# Patient Record
Sex: Male | Born: 2000 | Race: Black or African American | Hispanic: No | Marital: Single | Smoking: Never smoker
Health system: Southern US, Community
[De-identification: ages and names within clinical notes are randomized; demographics above are authoritative.]

## PROBLEM LIST (undated history)

## (undated) HISTORY — PX: COLON SURGERY: SHX602

## (undated) HISTORY — PX: CHOLECYSTECTOMY: SHX55

---
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
## 2020-02-19 ENCOUNTER — Ambulatory Visit (INDEPENDENT_AMBULATORY_CARE_PROVIDER_SITE_OTHER): Payer: 59 | Admitting: Family Medicine

## 2020-02-19 ENCOUNTER — Encounter: Payer: Self-pay | Admitting: Family Medicine

## 2020-02-19 ENCOUNTER — Other Ambulatory Visit: Payer: Self-pay

## 2020-02-19 VITALS — BP 106/72 | HR 77 | Temp 98.0°F | Ht 73.07 in | Wt 293.2 lb

## 2020-02-19 DIAGNOSIS — Z23 Encounter for immunization: Secondary | ICD-10-CM

## 2020-02-19 DIAGNOSIS — Z6838 Body mass index (BMI) 38.0-38.9, adult: Secondary | ICD-10-CM | POA: Diagnosis not present

## 2020-02-19 DIAGNOSIS — E6609 Other obesity due to excess calories: Secondary | ICD-10-CM

## 2020-02-19 DIAGNOSIS — Z0000 Encounter for general adult medical examination without abnormal findings: Secondary | ICD-10-CM | POA: Diagnosis not present

## 2020-02-19 NOTE — Progress Notes (Addendum)
Subjective:     Roshard Britton Bera is a 19 y.o. male and is here for f/u and immunizations. Pt initially scheduled for CPE, but had CPE in March 2021.  The patient reports no problems.  Pt doing well.  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  Social History   Socioeconomic History  . Marital status: Single    Spouse name: Not on file  . Number of children: Not on file  . Years of education: Not on file  . Highest education level: Not on file  Occupational History  . Not on file  Tobacco Use  . Smoking status: Passive Smoke Exposure - Never Smoker  . Smokeless tobacco: Never Used  Substance and Sexual Activity  . Alcohol use: No  . Drug use: No  . Sexual activity: Not on file  Other Topics Concern  . Not on file  Social History Narrative  . Not on file   Social Determinants of Health   Financial Resource Strain:   . Difficulty of Paying Living Expenses: Not on file  Food Insecurity:   . Worried About Programme researcher, broadcasting/film/video in the Last Year: Not on file  . Ran Out of Food in the Last Year: Not on file  Transportation Needs:   . Lack of Transportation (Medical): Not on file  . Lack of Transportation (Non-Medical): Not on file  Physical Activity:   . Days of Exercise per Week: Not on file  . Minutes of Exercise per Session: Not on file  Stress:   . Feeling of Stress : Not on file  Social Connections:   . Frequency of Communication with Friends and Family: Not on file  . Frequency of Social Gatherings with Friends and Family: Not on file  . Attends Religious Services: Not on file  . Active Member of Clubs or Organizations: Not on file  . Attends Banker Meetings: Not on file  . Marital Status: Not on file  Intimate Partner Violence:   . Fear of Current or Ex-Partner: Not on file  . Emotionally Abused: Not on file  . Physically Abused: Not on file  . Sexually Abused: Not on file   Health Maintenance  Topic Date Due  .  Hepatitis C Screening  Never done  . HIV Screening  Never done  . TETANUS/TDAP  Never done  . INFLUENZA VACCINE  11/30/2019  . COVID-19 Vaccine  Completed    The following portions of the patient's history were reviewed and updated as appropriate: allergies, current medications, past family history, past medical history, past social history, past surgical history and problem list.  Review of Systems A comprehensive review of systems was negative.   Objective:    BP 106/72 (BP Location: Right Arm, Patient Position: Sitting, Cuff Size: Normal)   Pulse 77   Temp 98 F (36.7 C) (Oral)   Ht 6' 1.07" (1.856 m)   Wt 293 lb 3.2 oz (133 kg)   SpO2 99%   BMI 38.61 kg/m  General appearance: alert, cooperative, appears stated age and no distress Head: Normocephalic, without obvious abnormality, atraumatic Eyes: conjunctivae/corneas clear. PERRL, EOM's intact. Fundi benign. Ears: normal TM's and external ear canals both ears Nose: Nares normal. Septum midline. Mucosa normal. No drainage or sinus tenderness. Throat: lips, mucosa, and tongue normal; teeth and gums normal Neck: no adenopathy, no carotid bruit, no JVD, supple, symmetrical, trachea midline and mild thyromegaly, symmetric, without nodules Lungs: clear to auscultation  bilaterally Heart: regular rate and rhythm, S1, S2 normal, no murmur, click, rub or gallop Abdomen: soft, non-tender; bowel sounds normal; no masses,  no organomegaly Extremities: extremities normal, atraumatic, no cyanosis or edema Pulses: 2+ and symmetric Skin: Skin color, texture, turgor normal. No rashes or lesions Lymph nodes: Cervical, supraclavicular, and axillary nodes normal. Neurologic: Alert and oriented X 3, normal strength and tone. Normal symmetric reflexes. Normal coordination and gait    Assessment:    Healthy male exam with obesity.     Plan:   Class 2 obesity due to excess calories without serious comorbidities with BMI 38.0-38.9 -mild  thyromegaly -discussed increasing physical activity and other lifestyle modifications -will obtain labs - Plan: Lipid panel, Hemoglobin A1c, TSH, T4, free  Need for diphtheria-tetanus-pertussis (Tdap) vaccine  - Plan: Tdap vaccine greater than or equal to 7yo IM  Need for immunization against influenza  - Plan: Flu Vaccine QUAD 6+ mos PF IM (Fluarix Quad PF)  F/u prn  Abbe Amsterdam, MD

## 2020-02-19 NOTE — Patient Instructions (Signed)
Preventive Care 23-19 Years Old, Male Preventive care refers to lifestyle choices and visits with your health care provider that can promote health and wellness. At this stage in your life, you may start seeing a primary care physician instead of a pediatrician. Your health care is now your responsibility. Preventive care for young adults includes:  A yearly physical exam. This is also called an annual wellness visit.  Regular dental and eye exams.  Immunizations.  Screening for certain conditions.  Healthy lifestyle choices, such as diet and exercise. What can I expect for my preventive care visit? Physical exam Your health care provider may check:  Height and weight. These may be used to calculate body mass index (BMI), which is a measurement that tells if you are at a healthy weight.  Heart rate and blood pressure.  Body temperature. Counseling Your health care provider may ask you questions about:  Past medical problems and family medical history.  Alcohol, tobacco, and drug use.  Home and relationship well-being.  Access to firearms.  Emotional well-being.  Diet, exercise, and sleep habits.  Sexual activity and sexual health. What immunizations do I need?  Influenza (flu) vaccine  This is recommended every year. Tetanus, diphtheria, and pertussis (Tdap) vaccine  You may need a Td booster every 10 years. Varicella (chickenpox) vaccine  You may need this vaccine if you have not already been vaccinated. Human papillomavirus (HPV) vaccine  If recommended by your health care provider, you may need three doses over 6 months. Measles, mumps, and rubella (MMR) vaccine  You may need at least one dose of MMR. You may also need a second dose. Meningococcal conjugate (MenACWY) vaccine  One dose is recommended if you are 68-54 years old and a Market researcher living in a residence hall, or if you have one of several medical conditions. You may also need  additional booster doses. Pneumococcal conjugate (PCV13) vaccine  You may need this if you have certain conditions and were not previously vaccinated. Pneumococcal polysaccharide (PPSV23) vaccine  You may need one or two doses if you smoke cigarettes or if you have certain conditions. Hepatitis A vaccine  You may need this if you have certain conditions or if you travel or work in places where you may be exposed to hepatitis A. Hepatitis B vaccine  You may need this if you have certain conditions or if you travel or work in places where you may be exposed to hepatitis B. Haemophilus influenzae type b (Hib) vaccine  You may need this if you have certain risk factors. You may receive vaccines as individual doses or as more than one vaccine together in one shot (combination vaccines). Talk with your health care provider about the risks and benefits of combination vaccines. What tests do I need? Blood tests  Lipid and cholesterol levels. These may be checked every 5 years starting at age 24.  Hepatitis C test.  Hepatitis B test. Screening  Genital exam to check for testicular cancer or hernias.  Sexually transmitted disease (STD) testing, if you are at risk. Other tests  Tuberculosis skin test.  Vision and hearing tests.  Skin exam. Follow these instructions at home: Eating and drinking   Eat a diet that includes fresh fruits and vegetables, whole grains, lean protein, and low-fat dairy products.  Drink enough fluid to keep your urine pale yellow.  Do not drink alcohol if: ? Your health care provider tells you not to drink. ? You are under the legal drinking  age. In the [place] the legal drinking age is 40.  If you drink alcohol: ? Limit how much you have to 0-2 drinks a day. ? Be aware of how much alcohol is in your drink. In the [place] one drink equals one 12 oz bottle of beer (355 mL), one 5 oz glass of wine (148 mL), or one 1 oz glass of hard liquor (44  mL). Lifestyle  Take daily care of your teeth and gums.  Stay active. Exercise at least 30 minutes 5 or more days of the week.  Do not use any products that contain nicotine or tobacco, such as cigarettes, e-cigarettes, and chewing tobacco. If you need help quitting, ask your health care provider.  Do not use drugs.  If you are sexually active, practice safe sex. Use a condom or other form of protection to prevent STIs (sexually transmitted infections).  Find healthy ways to cope with stress, such as: ? Meditation, yoga, or listening to music. ? Journaling. ? Talking to a trusted person. ? Spending time with friends and family. Safety  Always wear your seat belt while driving or riding in a vehicle.  Do not drive if you have been drinking alcohol.  Do not ride with someone who has been drinking.  Do not drive when you are tired or distracted.  Do not text while driving.  Wear a helmet and other protective equipment during sports activities.  If you have firearms in your house, make sure you follow all gun safety procedures.  Seek help if you have been bullied, physically abused, or sexually abused.  Use the Internet responsibly to avoid dangers such as online bullying and online sex predators. What's next?  Go to your health care provider once a year for a well check visit.  Ask your health care provider how often you should have your eyes and teeth checked.  Stay up to date on all vaccines. This information is not intended to replace advice given to you by your health care provider. Make sure you discuss any questions you have with your health care provider. Document Revised: 04/11/2018 Document Reviewed: 04/11/2018 Elsevier Patient Education  2020 [employer].  Immunization Schedule, 53-19 Years Old Vaccines are usually given at various ages, according to a schedule. Your health care provider will recommend vaccines for you based on your age, medical history, and  lifestyle or other factors, such as travel or where you work. You may receive vaccines as individual doses or as more than one vaccine together in one shot (combination vaccine). Talk with your health care provider about the risks and benefits of combination vaccines. Before you get a vaccine: Talk with your health care provider about which vaccines are right for you. This is especially important if:  You previously had a reaction after getting a vaccine.  You have a weakened disease-fighting system (immune system). You may have a weakened immune system if you: ? Are taking medicines that reduce (suppress) the activity of your immune system. ? Are taking medicines to treat cancer (chemotherapy). ? Have HIV or AIDS.  You work in an environment where you may be exposed to a disease.  You plan to travel outside of the country.  You have a chronic illness, such as heart disease, kidney disease, diabetes, or lung disease. Recommended immunizations for 71-69 years old Influenza vaccine This may also be called the IIV, RIV, or LAIV vaccine.  You should get a dose of the influenza vaccine every year. Tetanus, diphtheria, and  pertussis vaccine This is also known as the Tdap vaccine. You should get 1 dose of the Tdap vaccine if:  You have not previously gotten a Tdap vaccine.  You do not know if you have ever gotten a Tdap vaccine. Women should get 1 dose of the Tdap vaccine during each pregnancy. It is recommended that pregnant women receive this vaccine between 27 and 36 weeks of pregnancy. Tetanus, diphtheria vaccine This is also known as the Td vaccine.  You should get a dose of the Td vaccine every 10 years after 1 dose of Tdap vaccine. Measles, mumps, and rubella vaccine This is also known as the MMR vaccine. You may need to get the MMR vaccine if:  You need to catch up on doses you missed in the past.  You have not been given the vaccine before.  You do not have evidence of  immunity (by a blood test). Pregnant women should not get the MMR vaccine during pregnancy because it may be harmful to the unborn baby. However, if you are not immune to measles, mumps, or rubella, you should get a dose of MMR vaccine within days after delivery. Varicella vaccine This is also known as the VAR vaccine. You may need to get the VAR vaccine if:  You need to catch up on doses you missed in the past.  You have not been given the vaccine before.  You do not have evidence of immunity (by a blood test).  You have certain high-risk conditions, such as HIV or AIDS. Pregnant women should not get the VAR vaccine during pregnancy because it may be harmful to the unborn baby. However, if you are not immune to chickenpox (varicella), you should get a dose of the VAR vaccine within days after delivery. Human papillomavirus vaccine This is also known as the HPV vaccine. You should get the HPV vaccine if:  You have not gotten the vaccine before. The vaccine is recommended for all adults through age 48.  You need to catch up on doses you missed in the past. Pneumococcal conjugate vaccine This is also known as the PCV13 vaccine. You should get the PCV13 vaccine as recommended if you have certain high-risk conditions. These include:  Diabetes.  Chronic conditions of the heart, lungs, or liver.  Conditions that affect the immune system. Pneumococcal polysaccharide vaccine This is also known as the PPSV23 vaccine. You should get the PPSV23 vaccine as recommended if you have certain high-risk conditions. These include:  Diabetes.  Chronic conditions of the heart, lungs, or liver.  Conditions that affect the immune system. Hepatitis A vaccine This is also known as the HepA vaccine. If you did not get the HepA vaccine previously, you should get it if:  You are at risk for a hepatitis A infection. You may be at risk for infection if you: ? Have chronic liver disease. ? Have HIV or  AIDS. ? Are a man who has sex with men. ? Use illegal drugs. ? Are homeless. ? May be exposed to hepatitis A through work. ? Travel to countries where hepatitis A is common. ? Are pregnant. ? Have or will have close contact with someone who was adopted from another country.  You are not at risk for infection but want protection from hepatitis A. Hepatitis B vaccine This is also known as the HepB vaccine. If you did not get the HepB vaccine previously, you should get it if:  You are at risk for hepatitis B infection. You are  at risk if you: ? Have chronic liver disease. ? Have HIV or AIDS. ? Have sex with a partner who has hepatitis B, or:  You have multiple sex partners.  You are a man who has sex with men. ? Use illegal drugs. ? May be exposed to hepatitis B through work. ? Live with someone who has hepatitis B. ? Receive dialysis treatment. ? Have diabetes mellitus. ? Travel to countries where hepatitis B is common. ? Are pregnant.  You are not at risk of infection but want protection from hepatitis B. Meningococcal conjugate vaccine This is also known as the MenACWY vaccine. You may need to get the MenACWY vaccine if you:  Have not been given the vaccine before.  Need to catch up on doses you missed in the past. This vaccine is especially important if you:  Do not have a spleen.  Have sickle cell disease.  Have HIV.  Take medicines that suppress your immune system.  Travel to countries where meningococcal disease is common.  Are exposed to Neisseria meningitidis at work. Serogroup B meningococcal vaccine This is also known as the MenB vaccine. You may need to get the MenB vaccine if you:  Have not been given the vaccine before.  Need to catch up on doses you missed in the past. This vaccine is especially important if you:  Do not have a spleen.  Have sickle cell disease.  Take medicines that suppress your immune system.  Are exposed to Neisseria  meningitidis at work. Haemophilus influenzae type b vaccine This is also known as the Hib vaccine. Anyone older than 19 years of age is usually not given the Hib vaccine. However, if you have certain high-risk conditions, you may need to get this vaccine. These conditions include:  Not having a spleen.  Having received a stem cell transplant. Summary  Before you get a vaccine, tell your health care provider if you have reacted to vaccines in the past or have a condition that weakens your immune system.  At 19-21 years, you should get a dose of the flu vaccine every year and a dose of the Td vaccine if 10 years have passed since you had a Tdap vaccine.  Depending on your medical history and your risk factors, you may also need other vaccines. Ask your health care provider whether you are up to date on all your vaccines.  Women who are pregnant may not receive certain vaccines. Ask your health care provider whether you should receive any vaccines soon after you deliver your baby. This information is not intended to replace advice given to you by your health care provider. Make sure you discuss any questions you have with your health care provider. Document Revised: 08/08/2018 Document Reviewed: 07/05/2018 Elsevier Patient Education  [place].

## 2020-02-20 LAB — CBC WITH DIFFERENTIAL/PLATELET
Absolute Monocytes: 713 cells/uL (ref 200–950)
Basophils Absolute: 22 cells/uL (ref 0–200)
Basophils Relative: 0.3 %
Eosinophils Absolute: 259 cells/uL (ref 15–500)
Eosinophils Relative: 3.6 %
HCT: 42.6 % (ref 38.5–50.0)
Hemoglobin: 14.2 g/dL (ref 13.2–17.1)
Lymphs Abs: 2902 cells/uL (ref 850–3900)
MCH: 27.3 pg (ref 27.0–33.0)
MCHC: 33.3 g/dL (ref 32.0–36.0)
MCV: 81.9 fL (ref 80.0–100.0)
MPV: 11 fL (ref 7.5–12.5)
Monocytes Relative: 9.9 %
Neutro Abs: 3305 cells/uL (ref 1500–7800)
Neutrophils Relative %: 45.9 %
Platelets: 283 10*3/uL (ref 140–400)
RBC: 5.2 10*6/uL (ref 4.20–5.80)
RDW: 13.1 % (ref 11.0–15.0)
Total Lymphocyte: 40.3 %
WBC: 7.2 10*3/uL (ref 3.8–10.8)

## 2020-02-20 LAB — BASIC METABOLIC PANEL WITH GFR
BUN: 8 mg/dL (ref 7–20)
CO2: 29 mmol/L (ref 20–32)
Calcium: 10.1 mg/dL (ref 8.9–10.4)
Chloride: 106 mmol/L (ref 98–110)
Creat: 1.08 mg/dL (ref 0.60–1.26)
GFR, Est African American: 115 mL/min/{1.73_m2} (ref >=60)
GFR, Est Non African American: 99 mL/min/{1.73_m2} (ref >=60)
Glucose, Bld: 107 mg/dL — ABNORMAL HIGH (ref 65–99)
Potassium: 4.7 mmol/L (ref 3.8–5.1)
Sodium: 141 mmol/L (ref 135–146)

## 2020-02-20 LAB — LIPID PANEL
Cholesterol: 148 mg/dL (ref <170)
HDL: 43 mg/dL — ABNORMAL LOW (ref >45)
LDL Cholesterol (Calc): 88 mg/dL (calc) (ref <110)
Non-HDL Cholesterol (Calc): 105 mg/dL (calc) (ref <120)
Total CHOL/HDL Ratio: 3.4 (calc) (ref <5.0)
Triglycerides: 83 mg/dL (ref <90)

## 2020-02-20 LAB — T4, FREE: Free T4: 1.1 ng/dL (ref 0.8–1.4)

## 2020-02-20 LAB — TSH: TSH: 5.53 mIU/L — ABNORMAL HIGH (ref 0.50–4.30)

## 2020-02-20 LAB — HEMOGLOBIN A1C
Hgb A1c MFr Bld: 5.7 % of total Hgb — ABNORMAL HIGH (ref <5.7)
Mean Plasma Glucose: 117 (calc)
eAG (mmol/L): 6.5 (calc)

## 2020-02-23 ENCOUNTER — Encounter: Payer: Self-pay | Admitting: Family Medicine

## 2020-02-23 ENCOUNTER — Telehealth: Payer: Self-pay | Admitting: *Deleted

## 2020-02-23 NOTE — Telephone Encounter (Signed)
Patient states he is returning a call.

## 2020-02-25 NOTE — Telephone Encounter (Signed)
Returned pt call left a message to call the office back

## 2020-03-11 NOTE — Telephone Encounter (Signed)
Not able to speak with pt due to pt not answering my call and not calling the office back

## 2020-04-26 ENCOUNTER — Other Ambulatory Visit (HOSPITAL_COMMUNITY): Payer: Self-pay | Admitting: Dermatology

## 2020-04-26 DIAGNOSIS — L718 Other rosacea: Secondary | ICD-10-CM | POA: Diagnosis not present

## 2020-04-26 DIAGNOSIS — L738 Other specified follicular disorders: Secondary | ICD-10-CM | POA: Diagnosis not present

## 2020-04-26 DIAGNOSIS — L218 Other seborrheic dermatitis: Secondary | ICD-10-CM | POA: Diagnosis not present

## 2020-05-04 ENCOUNTER — Ambulatory Visit: Payer: 59 | Attending: Internal Medicine

## 2020-05-04 DIAGNOSIS — Z23 Encounter for immunization: Secondary | ICD-10-CM

## 2020-05-04 NOTE — Progress Notes (Signed)
   Covid-[facility] Vaccination [facility]  Name:  Dhiren Azimi    MRN: [id number] DOB: Dec 05, 2000  05/04/2020  Mr. Carandang was observed post Covid-19 immunization for 15 minutes without incident. He was provided with Vaccine Information Sheet and instruction to access the V-Safe system.   Mr. Farruggia was instructed to call 911 with any severe reactions post vaccine: Marland Kitchen Difficulty breathing  . Swelling of face and throat  . A fast heartbeat  . A bad rash all over body  . Dizziness and weakness   Immunizations Administered    Name Date Dose VIS Date Route   Pfizer COVID-19 Vaccine 05/04/2020  2:51 PM 0.3 mL 02/18/2020 Intramuscular   Manufacturer: [employer], [employer]   Lot: [id number]   NDC: [address]-9

## 2020-10-28 ENCOUNTER — Ambulatory Visit: Payer: 59 | Attending: Critical Care Medicine

## 2020-10-28 DIAGNOSIS — Z20822 Contact with and (suspected) exposure to covid-19: Secondary | ICD-10-CM

## 2020-10-29 LAB — NOVEL CORONAVIRUS, NAA: SARS-CoV-2, NAA: DETECTED — AB

## 2020-10-29 LAB — SARS-COV-2, NAA 2 DAY TAT

## 2020-11-03 ENCOUNTER — Ambulatory Visit: Payer: 59 | Attending: Internal Medicine

## 2020-11-03 DIAGNOSIS — Z20822 Contact with and (suspected) exposure to covid-19: Secondary | ICD-10-CM

## 2020-11-04 LAB — SPECIMEN STATUS REPORT

## 2020-11-04 LAB — SARS-COV-2, NAA 2 DAY TAT

## 2020-11-04 LAB — NOVEL CORONAVIRUS, NAA: SARS-CoV-2, NAA: DETECTED — AB

## 2021-01-04 ENCOUNTER — Other Ambulatory Visit (HOSPITAL_COMMUNITY): Payer: Self-pay

## 2021-02-07 ENCOUNTER — Other Ambulatory Visit (HOSPITAL_COMMUNITY): Payer: Self-pay

## 2021-02-23 ENCOUNTER — Other Ambulatory Visit: Payer: Self-pay

## 2021-02-23 ENCOUNTER — Encounter: Payer: Self-pay | Admitting: Family Medicine

## 2021-02-23 ENCOUNTER — Ambulatory Visit (INDEPENDENT_AMBULATORY_CARE_PROVIDER_SITE_OTHER): Payer: 59 | Admitting: Family Medicine

## 2021-02-23 VITALS — BP 128/78 | HR 69 | Temp 97.9°F | Ht 72.0 in | Wt 286.0 lb

## 2021-02-23 DIAGNOSIS — E038 Other specified hypothyroidism: Secondary | ICD-10-CM | POA: Diagnosis not present

## 2021-02-23 DIAGNOSIS — E6609 Other obesity due to excess calories: Secondary | ICD-10-CM | POA: Diagnosis not present

## 2021-02-23 DIAGNOSIS — Z6838 Body mass index (BMI) 38.0-38.9, adult: Secondary | ICD-10-CM

## 2021-02-23 DIAGNOSIS — Z23 Encounter for immunization: Secondary | ICD-10-CM | POA: Diagnosis not present

## 2021-02-23 DIAGNOSIS — Z0000 Encounter for general adult medical examination without abnormal findings: Secondary | ICD-10-CM | POA: Diagnosis not present

## 2021-02-23 LAB — BASIC METABOLIC PANEL
BUN: 8 mg/dL (ref 6–23)
CO2: 27 mEq/L (ref 19–32)
Calcium: 10 mg/dL (ref 8.4–10.5)
Chloride: 103 mEq/L (ref 96–112)
Creatinine, Ser: 1.04 mg/dL (ref 0.40–1.50)
GFR: 103.42 mL/min (ref >60.00)
Glucose, Bld: 107 mg/dL — ABNORMAL HIGH (ref 70–99)
Potassium: 3.8 mEq/L (ref 3.5–5.1)
Sodium: 138 mEq/L (ref 135–145)

## 2021-02-23 LAB — LIPID PANEL
Cholesterol: 153 mg/dL (ref 0–200)
HDL: 41.8 mg/dL (ref >39.00)
LDL Cholesterol: 94 mg/dL (ref 0–99)
NonHDL: 111.09
Total CHOL/HDL Ratio: 4
Triglycerides: 87 mg/dL (ref 0.0–149.0)
VLDL: 17.4 mg/dL (ref 0.0–40.0)

## 2021-02-23 LAB — TSH: TSH: 2.24 u[IU]/mL (ref 0.35–5.50)

## 2021-02-23 LAB — CBC WITH DIFFERENTIAL/PLATELET
Basophils Absolute: 0 10*3/uL (ref 0.0–0.1)
Basophils Relative: 0.2 % (ref 0.0–3.0)
Eosinophils Absolute: 0.2 10*3/uL (ref 0.0–0.7)
Eosinophils Relative: 2.7 % (ref 0.0–5.0)
HCT: 42.7 % (ref 39.0–52.0)
Hemoglobin: 14.1 g/dL (ref 13.0–17.0)
Lymphocytes Relative: 27.8 % (ref 12.0–46.0)
Lymphs Abs: 1.6 10*3/uL (ref 0.7–4.0)
MCHC: 33 g/dL (ref 30.0–36.0)
MCV: 80.8 fl (ref 78.0–100.0)
Monocytes Absolute: 0.5 10*3/uL (ref 0.1–1.0)
Monocytes Relative: 9.3 % (ref 3.0–12.0)
Neutro Abs: 3.4 10*3/uL (ref 1.4–7.7)
Neutrophils Relative %: 60 % (ref 43.0–77.0)
Platelets: 280 10*3/uL (ref 150.0–400.0)
RBC: 5.28 Mil/uL (ref 4.22–5.81)
RDW: 13.7 % (ref 11.5–14.6)
WBC: 5.7 10*3/uL (ref 4.5–10.5)

## 2021-02-23 LAB — HEMOGLOBIN A1C: Hgb A1c MFr Bld: 5.9 % (ref 4.6–6.5)

## 2021-02-23 LAB — T4, FREE: Free T4: 0.84 ng/dL (ref 0.60–1.60)

## 2021-02-23 LAB — VITAMIN D 25 HYDROXY (VIT D DEFICIENCY, FRACTURES): VITD: 10.42 ng/mL — ABNORMAL LOW (ref 30.00–100.00)

## 2021-02-23 NOTE — Addendum Note (Signed)
Addended by: Elwin Mocha on: 02/23/2021 11:15 AM   Modules accepted: Orders

## 2021-02-23 NOTE — Progress Notes (Signed)
Subjective:     Roy Green is a 20 y.o. male and is here for a comprehensive physical exam. Pt states he is doing well without issues.  Has another yr in college, then planning to go to law school.    Social History   Socioeconomic History   Marital status: Single    Spouse name: Not on file   Number of children: Not on file   Years of education: Not on file   Highest education level: Not on file  Occupational History   Not on file  Tobacco Use   Smoking status: Passive Smoke Exposure - Never Smoker   Smokeless tobacco: Never  Substance and Sexual Activity   Alcohol use: No   Drug use: No   Sexual activity: Not on file  Other Topics Concern   Not on file  Social History Narrative   Not on file   Social Determinants of Health   Financial Resource Strain: Not on file  Food Insecurity: Not on file  Transportation Needs: Not on file  Physical Activity: Not on file  Stress: Not on file  Social Connections: Not on file  Intimate Partner Violence: Not on file   Health Maintenance  Topic Date Due   HIV Screening  Never done   Hepatitis C Screening  Never done   COVID-19 Vaccine (4 - Booster for Pfizer series) 06/29/2020   INFLUENZA VACCINE  11/29/2020   TETANUS/TDAP  02/18/2030   HPV VACCINES  Completed   Pneumococcal Vaccine 85-19 Years old  Aged Out    The following portions of the patient's history were reviewed and updated as appropriate: allergies, current medications, past family history, past medical history, past social history, past surgical history, and problem list.  Review of Systems Pertinent items noted in HPI and remainder of comprehensive ROS otherwise negative.   Objective:    BP 128/78 (BP Location: Right Arm, Patient Position: Sitting, Cuff Size: Large)   Pulse 69   Temp 97.9 F (36.6 C) (Oral)   Ht 6' (1.829 m)   Wt 286 lb (129.7 kg)   SpO2 99%   BMI 38.79 kg/m   General: Pleasant, cooperative, in NAD Head: Normocephalic, without  obvious abnormality, atraumatic Eyes: conjunctivae/corneas clear. PERRL, EOM's intact. Fundi benign. Ears: normal TM's and external ear canals both ears Nose: Nares normal. Septum midline. Mucosa normal. No drainage or sinus tenderness. Throat: lips, mucosa, and tongue normal; teeth and gums normal Neck: no adenopathy, no carotid bruit, no JVD, supple, symmetrical, trachea midline, and thyroid not enlarged, symmetric, no tenderness/mass/nodules Lungs: clear to auscultation bilaterally Heart: regular rate and rhythm, S1, S2 normal, no murmur, click, rub or gallop Abdomen: soft, non-tender; bowel sounds normal; no masses,  no organomegaly Extremities: extremities normal, atraumatic, no cyanosis or edema Pulses: 2+ and symmetric Skin: Skin color, texture, turgor normal.  Mild acanthosis nigricans.  No rashes or lesions Lymph nodes: Cervical, supraclavicular, and axillary nodes normal. Neurologic: Alert and oriented X 3, normal strength and tone. Normal symmetric reflexes. Normal coordination and gait    Assessment:    Healthy male exam.      Plan:    Anticipatory guidance given including wearing seatbelts, smoke detectors in the home, increasing physical activity, increasing p.o. intake of water and vegetables. -will obtain labs -Discussed immunizations -Given handout -Next CPE in 1 year See After Visit Summary for Counseling Recommendations   Subclinical hypothyroidism  -Stable, asymptomatic - Plan: TSH, T4, Free  Class 2 obesity due to excess calories  without serious comorbidity with body mass index (BMI) of 38.0 to 38.9 in adult -Lifestyle modifications encouraged - Plan: Hemoglobin A1c, Lipid panel, Vitamin D, 25-hydroxy  Need for influenza vaccination -Given this visit  Follow-up as needed  Abbe Amsterdam, MD

## 2021-03-02 ENCOUNTER — Other Ambulatory Visit: Payer: Self-pay | Admitting: Family Medicine

## 2021-03-02 ENCOUNTER — Other Ambulatory Visit (HOSPITAL_COMMUNITY): Payer: Self-pay

## 2021-03-02 DIAGNOSIS — E559 Vitamin D deficiency, unspecified: Secondary | ICD-10-CM | POA: Insufficient documentation

## 2021-03-02 MED ORDER — VITAMIN D (ERGOCALCIFEROL) 1.25 MG (50000 UNIT) PO CAPS
50000.0000 [IU] | ORAL_CAPSULE | ORAL | 0 refills | Status: DC
  Filled 2021-03-02: qty 12, 84d supply, fill #0

## 2021-03-06 ENCOUNTER — Ambulatory Visit (HOSPITAL_COMMUNITY)
Admission: EM | Admit: 2021-03-06 | Discharge: 2021-03-06 | Disposition: A | Discharge status: Home / Self Care | Payer: 59 | Attending: Obstetrics and Gynecology | Admitting: Obstetrics and Gynecology

## 2021-03-06 ENCOUNTER — Encounter (HOSPITAL_COMMUNITY): Payer: Self-pay

## 2021-03-06 DIAGNOSIS — S39012A Strain of muscle, fascia and tendon of lower back, initial encounter: Secondary | ICD-10-CM | POA: Diagnosis not present

## 2021-03-06 LAB — POCT URINALYSIS DIPSTICK, ED / UC
Bilirubin Urine: NEGATIVE
Glucose, UA: NEGATIVE mg/dL
Hgb urine dipstick: NEGATIVE
Leukocytes,Ua: NEGATIVE
Nitrite: NEGATIVE
Protein, ur: NEGATIVE mg/dL
Specific Gravity, Urine: >=1.03 (ref 1.005–1.030)
Urobilinogen, UA: 2 mg/dL — ABNORMAL HIGH (ref 0.0–1.0)
pH: 6 (ref 5.0–8.0)

## 2021-03-06 MED ORDER — TIZANIDINE HCL 2 MG PO TABS: 2.0000 mg | ORAL_TABLET | Freq: Three times a day (TID) | ORAL | 0 refills | Status: DC | PRN

## 2021-03-06 NOTE — ED Provider Notes (Signed)
[facility]    CSN: [id number] Arrival date & time: 03/06/21  1104      History   Chief Complaint Chief Complaint  Patient presents with   Back Pain    HPI Roy Green is a 20 y.o. male presenting with 3 days of lower back pain.  Medical history cholecystectomy.  Here today with mom.  They describe 3 days of nontraumatic back pain, worse with movement and sitting up straight.  Denies other associated symptoms including abdominal pain, fevers and chills, urinary symptoms.  Denies trauma or overuse.  Has not tried interventions for the symptoms.  Mom states that he had cholecystitis in the past and they are concerned that he has another abdominal issue, despite the lack of abdominal pain.  HPI  Past Medical History:  Diagnosis Date   Premature baby     Patient Active Problem List   Diagnosis Date Noted   Vitamin D deficiency 03/02/2021    Past Surgical History:  Procedure Laterality Date   CHOLECYSTECTOMY     TONSILECTOMY, ADENOIDECTOMY, BILATERAL MYRINGOTOMY AND TUBES Bilateral 2010       Home Medications    Prior to Admission medications   Medication Sig Start Date End Date Taking? Authorizing Provider  tiZANidine (ZANAFLEX) 2 MG tablet Take 1 tablet (2 mg total) by mouth every 8 (eight) hours as needed for muscle spasms. 03/06/21  Yes Rhys Martini, PA-C  clindamycin-benzoyl peroxide (BENZACLIN) gel Apply topically 2 (two) times daily. 07/17/19   Deeann Saint, MD  Clobetasol Propionate 0.05 % shampoo Apply to thin layer to scalp.  Leave on for 15 mins. Then rinse.  Use for the next 2 wks. 07/17/19   Deeann Saint, MD  ibuprofen (ADVIL,MOTRIN) 100 MG chewable tablet Chew 100 mg by mouth every 8 (eight) hours as needed.    [provider]  metroNIDAZOLE (METROCREAM) 0.75 % cream APPLY 1 APPLICATION ON THE SKIN NIGHTLY 04/26/20 04/26/21  Aris Lot, MD  nystatin cream (MYCOSTATIN) Apply 1 application topically 2 (two) times  daily. Apply to stomach. 07/17/19   Deeann Saint, MD  Vitamin D, Ergocalciferol, (DRISDOL) 1.25 MG (50000 UNIT) CAPS capsule Take 1 capsule (50,000 Units) by mouth every 7 (seven) days. 03/02/21   Deeann Saint, MD    Family History Family History  Problem Relation Age of Onset   Hypertension Father    Hypertension Paternal Grandfather     Social History Social History   Tobacco Use   Smoking status: Passive Smoke Exposure - Never Smoker   Smokeless tobacco: Never  Substance Use Topics   Alcohol use: No   Drug use: No     Allergies   Penicillins   Review of Systems Review of Systems  Musculoskeletal:  Positive for back pain.  All other systems reviewed and are negative.   Physical Exam Triage Vital Signs ED Triage Vitals  Enc Vitals Group     BP 03/06/21 1250 135/90     Pulse Rate 03/06/21 1250 68     Resp 03/06/21 1250 19     Temp 03/06/21 1250 98.3 F (36.8 C)     Temp Source 03/06/21 1250 Oral     SpO2 03/06/21 1250 97 %     Weight --      Height --      Head Circumference --      Peak Flow --      Pain Score 03/06/21 1248 5     Pain Loc --  Pain Edu? --      Excl. in GC? --    No data found.  Updated Vital Signs BP 135/90 (BP Location: Right Wrist)   Pulse 68   Temp 98.3 F (36.8 C) (Oral)   Resp 19   SpO2 97%   Visual Acuity Right Eye Distance:   Left Eye Distance:   Bilateral Distance:    Right Eye Near:   Left Eye Near:    Bilateral Near:     Physical Exam Vitals reviewed.  Constitutional:      General: He is not in acute distress.    Appearance: Normal appearance. He is not ill-appearing.  HENT:     Head: Normocephalic and atraumatic.  Cardiovascular:     Rate and Rhythm: Normal rate and regular rhythm.     Heart sounds: Normal heart sounds.  Pulmonary:     Effort: Pulmonary effort is normal.     Breath sounds: Normal breath sounds and air entry.  Abdominal:     Tenderness: There is no abdominal tenderness. There  is no right CVA tenderness, left CVA tenderness, guarding or rebound.     Comments: No pain  Musculoskeletal:     Cervical back: Normal range of motion. No swelling, deformity, signs of trauma, rigidity, spasms, tenderness, bony tenderness or crepitus. No pain with movement.     Thoracic back: No swelling, deformity, signs of trauma, spasms, tenderness or bony tenderness. Normal range of motion. No scoliosis.     Lumbar back: No swelling, deformity, signs of trauma, spasms, tenderness or bony tenderness. Normal range of motion. Negative right straight leg raise test and negative left straight leg raise test. No scoliosis.     Comments: No tenderness to palpation.  Bilateral lumbar paraspinous muscle tenderness elicited with extension lumbar spine.  Strength and sensation grossly intact upper and lower extremities.  No saddle anesthesia.  Gait intact. No midline spinous tenderness, deformity, stepoff.  Absolutely no other injury, deformity, tenderness, ecchymosis, abrasion.  Neurological:     General: No focal deficit present.     Mental Status: He is alert.     Cranial Nerves: No cranial nerve deficit.  Psychiatric:        Mood and Affect: Mood normal.        Behavior: Behavior normal.        Thought Content: Thought content normal.        Judgment: Judgment normal.     UC Treatments / Results  Labs (all labs ordered are listed, but only abnormal results are displayed) Labs Reviewed  POCT URINALYSIS DIPSTICK, ED / UC - Abnormal; Notable for the following components:      Result Value   Ketones, ur TRACE (*)    Urobilinogen, UA 2.0 (*)    All other components within normal limits    EKG   Radiology No results found.  Procedures Procedures (including critical care time)  Medications Ordered in UC Medications - No data to display  Initial Impression / Assessment and Plan / UC Course  I have reviewed the triage vital signs and the nursing notes.  Pertinent labs & imaging  results that were available during my care of the patient were reviewed by me and considered in my medical decision making (see chart for details).     This patient is a very pleasant 20 y.o. year old male presenting with lumbar strain. No red flag symptoms. UA taken in triage- wnl. Zanaflex, ROM exercises. ED return precautions discussed. Patient and  mom verbalizes understanding and agreement.  .   Final Clinical Impressions(s) / UC Diagnoses   Final diagnoses:  Strain of lumbar region, initial encounter     Discharge Instructions      -Start the muscle relaxer-Zanaflex (tizanidine), up to 3 times daily for muscle spasms and pain.  This can make you drowsy, so take at bedtime or when you do not need to drive or operate machinery. -You can take Tylenol up to 1000 mg 3 times daily, and ibuprofen up to 600 mg 3 times daily with food.  You can take these together, or alternate every 3-4 hours. -Heating pad, gentle stretching   ED Prescriptions     Medication Sig Dispense Auth. Provider   tiZANidine (ZANAFLEX) 2 MG tablet Take 1 tablet (2 mg total) by mouth every 8 (eight) hours as needed for muscle spasms. 21 tablet Rhys Martini, PA-C      PDMP not reviewed this encounter.   Rhys Martini, PA-C 03/06/21 1410

## 2021-03-06 NOTE — ED Triage Notes (Signed)
Pt report lower back pain x 3 days.

## 2021-03-06 NOTE — Discharge Instructions (Addendum)
-  Start the muscle relaxer-Zanaflex (tizanidine), up to 3 times daily for muscle spasms and pain.  This can make you drowsy, so take at bedtime or when you do not need to drive or operate machinery. -You can take Tylenol up to 1000 mg 3 times daily, and ibuprofen up to 600 mg 3 times daily with food.  You can take these together, or alternate every 3-4 hours. -Heating pad, gentle stretching

## 2021-03-07 ENCOUNTER — Emergency Department (HOSPITAL_COMMUNITY)
Admission: EM | Admit: 2021-03-07 | Discharge: 2021-03-07 | Disposition: A | Discharge status: Home / Self Care | Payer: 59 | Attending: Emergency Medicine | Admitting: Emergency Medicine

## 2021-03-07 ENCOUNTER — Emergency Department (HOSPITAL_COMMUNITY): Payer: 59

## 2021-03-07 ENCOUNTER — Encounter (HOSPITAL_COMMUNITY): Payer: Self-pay | Admitting: Radiology

## 2021-03-07 DIAGNOSIS — R109 Unspecified abdominal pain: Secondary | ICD-10-CM | POA: Diagnosis not present

## 2021-03-07 DIAGNOSIS — R197 Diarrhea, unspecified: Secondary | ICD-10-CM | POA: Diagnosis not present

## 2021-03-07 DIAGNOSIS — Z20822 Contact with and (suspected) exposure to covid-19: Secondary | ICD-10-CM | POA: Insufficient documentation

## 2021-03-07 DIAGNOSIS — Z7722 Contact with and (suspected) exposure to environmental tobacco smoke (acute) (chronic): Secondary | ICD-10-CM | POA: Diagnosis not present

## 2021-03-07 LAB — CBC WITH DIFFERENTIAL/PLATELET
Abs Immature Granulocytes: 0.01 10*3/uL (ref 0.00–0.07)
Basophils Absolute: 0 10*3/uL (ref 0.0–0.1)
Basophils Relative: 0 %
Eosinophils Absolute: 0.2 10*3/uL (ref 0.0–0.5)
Eosinophils Relative: 3 %
HCT: 44 % (ref 39.0–52.0)
Hemoglobin: 14.5 g/dL (ref 13.0–17.0)
Immature Granulocytes: 0 %
Lymphocytes Relative: 36 %
Lymphs Abs: 1.9 10*3/uL (ref 0.7–4.0)
MCH: 27 pg (ref 26.0–34.0)
MCHC: 33 g/dL (ref 30.0–36.0)
MCV: 81.8 fL (ref 80.0–100.0)
Monocytes Absolute: 0.5 10*3/uL (ref 0.1–1.0)
Monocytes Relative: 9 %
Neutro Abs: 2.7 10*3/uL (ref 1.7–7.7)
Neutrophils Relative %: 52 %
Platelets: 297 10*3/uL (ref 150–400)
RBC: 5.38 MIL/uL (ref 4.22–5.81)
RDW: 13.2 % (ref 11.5–15.5)
WBC: 5.2 10*3/uL (ref 4.0–10.5)
nRBC: 0 % (ref 0.0–0.2)

## 2021-03-07 LAB — COMPREHENSIVE METABOLIC PANEL
ALT: 37 U/L (ref 0–44)
AST: 38 U/L (ref 15–41)
Albumin: 4.6 g/dL (ref 3.5–5.0)
Alkaline Phosphatase: 75 U/L (ref 38–126)
Anion gap: 5 (ref 5–15)
BUN: 8 mg/dL (ref 6–20)
CO2: 27 mmol/L (ref 22–32)
Calcium: 9.4 mg/dL (ref 8.9–10.3)
Chloride: 105 mmol/L (ref 98–111)
Creatinine, Ser: 0.96 mg/dL (ref 0.61–1.24)
GFR, Estimated: >60 mL/min (ref >60)
Glucose, Bld: 105 mg/dL — ABNORMAL HIGH (ref 70–99)
Potassium: 4.6 mmol/L (ref 3.5–5.1)
Sodium: 137 mmol/L (ref 135–145)
Total Bilirubin: 1.2 mg/dL (ref 0.3–1.2)
Total Protein: 8.2 g/dL — ABNORMAL HIGH (ref 6.5–8.1)

## 2021-03-07 LAB — RESP PANEL BY RT-PCR (FLU A&B, COVID) ARPGX2
Influenza A by PCR: NEGATIVE
Influenza B by PCR: NEGATIVE
SARS Coronavirus 2 by RT PCR: NEGATIVE

## 2021-03-07 LAB — LIPASE, BLOOD: Lipase: 22 U/L (ref 11–51)

## 2021-03-07 MED ORDER — IOHEXOL 350 MG/ML SOLN
100.0000 mL | Freq: Once | INTRAVENOUS | Status: AC | PRN
  Administered 2021-03-07: 100 mL via INTRAVENOUS

## 2021-03-07 MED ORDER — ONDANSETRON 4 MG PO TBDP: 4.0000 mg | ORAL_TABLET | Freq: Three times a day (TID) | ORAL | 0 refills | Status: DC | PRN

## 2021-03-07 NOTE — Progress Notes (Signed)
Per Sandy Salaam ED charge, ok to start IV's and place patient back in the lobbies for their CT scans.  Patient Roy Green stated he understands, that if he decides to leave to advise the nursing staff know so they can remove his IV.  Patient stated he understood

## 2021-03-07 NOTE — ED Provider Notes (Signed)
[facility]-EMERGENCY DEPT Provider Note   CSN: [id number] Arrival date & time: 03/07/21  0946     History Chief Complaint  Patient presents with   Back Pain    Roy Green is a 20 y.o. male.  20 year old male with prior medical history detailed below presents for evaluation.  Patient reports approximately 6 days of loose watery diarrhea.  Patient reports cramping of his right low back 48 hours ago.  Patient was seen by urgent care and diagnosed with possible muscle strain.  Patient denies fever.  Patient denies abdominal pain.  Patient denies chest pain or shortness of breath.  He denies associated emesis or blood in stool.  The history is provided by the patient and a relative.  Illness Location:  Back pain, diarrhea Severity:  Mild Onset quality:  Gradual Duration:  1 week Timing:  Constant Progression:  Waxing and waning Chronicity:  New     Past Medical History:  Diagnosis Date   Premature baby     Patient Active Problem List   Diagnosis Date Noted   Vitamin D deficiency 03/02/2021    Past Surgical History:  Procedure Laterality Date   CHOLECYSTECTOMY     TONSILECTOMY, ADENOIDECTOMY, BILATERAL MYRINGOTOMY AND TUBES Bilateral 2010       Family History  Problem Relation Age of Onset   Hypertension Father    Hypertension Paternal Grandfather     Social History   Tobacco Use   Smoking status: Passive Smoke Exposure - Never Smoker   Smokeless tobacco: Never  Substance Use Topics   Alcohol use: No   Drug use: No    Home Medications Prior to Admission medications   Medication Sig Start Date End Date Taking? Authorizing Provider  ondansetron (ZOFRAN ODT) 4 MG disintegrating tablet Take 1 tablet (4 mg total) by mouth every 8 (eight) hours as needed for nausea or vomiting. 03/07/21  Yes Wynetta Fines, MD  clindamycin-benzoyl peroxide (BENZACLIN) gel Apply topically 2 (two) times daily. 07/17/19   Deeann Saint, MD   Clobetasol Propionate 0.05 % shampoo Apply to thin layer to scalp.  Leave on for 15 mins. Then rinse.  Use for the next 2 wks. 07/17/19   Deeann Saint, MD  ibuprofen (ADVIL,MOTRIN) 100 MG chewable tablet Chew 100 mg by mouth every 8 (eight) hours as needed.    [provider]  metroNIDAZOLE (METROCREAM) 0.75 % cream APPLY 1 APPLICATION ON THE SKIN NIGHTLY 04/26/20 04/26/21  Aris Lot, MD  nystatin cream (MYCOSTATIN) Apply 1 application topically 2 (two) times daily. Apply to stomach. 07/17/19   Deeann Saint, MD  tiZANidine (ZANAFLEX) 2 MG tablet Take 1 tablet (2 mg total) by mouth every 8 (eight) hours as needed for muscle spasms. 03/06/21   Rhys Martini, PA-C  Vitamin D, Ergocalciferol, (DRISDOL) 1.25 MG (50000 UNIT) CAPS capsule Take 1 capsule (50,000 Units) by mouth every 7 (seven) days. 03/02/21   Deeann Saint, MD    Allergies    Penicillins  Review of Systems   Review of Systems  All other systems reviewed and are negative.  Physical Exam Updated Vital Signs BP (!) 152/80 (BP Location: Left Arm)   Pulse 71   Temp 98.2 F (36.8 C) (Oral)   Resp 18   Ht 5\' 11"  (1.803 m)   Wt 129.3 kg   SpO2 100%   BMI 39.75 kg/m   Physical Exam Vitals and nursing note reviewed.  Constitutional:  General: He is not in acute distress.    Appearance: Normal appearance. He is well-developed.  HENT:     Head: Normocephalic and atraumatic.  Eyes:     Conjunctiva/sclera: Conjunctivae normal.     Pupils: Pupils are equal, round, and reactive to light.  Cardiovascular:     Rate and Rhythm: Normal rate and regular rhythm.     Heart sounds: Normal heart sounds.  Pulmonary:     Effort: Pulmonary effort is normal. No respiratory distress.     Breath sounds: Normal breath sounds.  Abdominal:     General: There is no distension.     Palpations: Abdomen is soft.     Tenderness: There is no abdominal tenderness.  Musculoskeletal:        General: No deformity.  Normal range of motion.     Cervical back: Normal range of motion and neck supple.  Skin:    General: Skin is warm and dry.  Neurological:     General: No focal deficit present.     Mental Status: He is alert and oriented to person, place, and time.    ED Results / Procedures / Treatments   Labs (all labs ordered are listed, but only abnormal results are displayed) Labs Reviewed  COMPREHENSIVE METABOLIC PANEL - Abnormal; Notable for the following components:      Result Value   Glucose, Bld 105 (*)    Total Protein 8.2 (*)    All other components within normal limits  RESP PANEL BY RT-PCR (FLU A&B, COVID) ARPGX2  CBC WITH DIFFERENTIAL/PLATELET  LIPASE, BLOOD    EKG None  Radiology CT ABDOMEN PELVIS W CONTRAST  Result Date: 03/07/2021 CLINICAL DATA:  Acute nonlocalized abdominal pain. 3-4 day history. Nausea. EXAM: CT ABDOMEN AND PELVIS WITH CONTRAST TECHNIQUE: Multidetector CT imaging of the abdomen and pelvis was performed using the standard protocol following bolus administration of intravenous contrast. CONTRAST:  OMNIPAQUE IOHEXOL 350 MG/ML SOLN COMPARISON:  None. FINDINGS: Lower chest: Lung bases are clear. Hepatobiliary: Mild diffuse fatty change of the liver. Previous cholecystectomy Pancreas: Normal Spleen: Normal Adrenals/Urinary Tract: Adrenal glands are normal. 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 Kidneys are normal. No cyst, mass, stone or hydronephrosis. Bladder is normal. Stomach/Bowel: The stomach and small intestine are otherwise normal. Normal appendix. No evidence of colon pathology. Vascular/Lymphatic: Normal Reproductive: Normal Other: No free fluid or air. Musculoskeletal: Normal IMPRESSION: Fatty liver. Previous cholecystectomy. No acute hepatobiliary pathology otherwise seen. Normal appearing appendix.  No significant bowel pathology seen. Electronically Signed   By: Paulina Fusi M.D.   On:  03/07/2021 14:10    Procedures Procedures   Medications Ordered in ED Medications  iohexol (OMNIPAQUE) 350 MG/ML injection 100 mL (100 mLs Intravenous Contrast Given 03/07/21 1350)    ED Course  I have reviewed the triage vital signs and the nursing notes.  Pertinent labs & imaging results that were available during my care of the patient were reviewed by me and considered in my medical decision making (see chart for details).    MDM Rules/Calculators/A&P                           MDM  MSE complete  Dal Andreaus Forst was evaluated in Emergency Department on 03/07/2021 for the symptoms described in the history of present illness. He was evaluated in the context of the global COVID-19 pandemic, which necessitated  consideration that the patient might be at risk for infection with the SARS-CoV-2 virus that causes COVID-19. Institutional protocols and algorithms that pertain to the evaluation of patients at risk for COVID-19 are in a state of rapid change based on information released by regulatory bodies including the CDC and federal and state organizations. These policies and algorithms were followed during the patient's care in the ED.  Patient is presenting with complaint of back pain and diarrhea.  Screen labs obtained in the ED are without significant abnormality.  CT imaging is without acute abnormality.  Patient reports that he feels significantly improved after his ED evaluation.  Patient is appropriate for outpatient management.  Importance of close follow-up was stressed.  Strict return precautions given and understood.  Final Clinical Impression(s) / ED Diagnoses Final diagnoses:  Diarrhea, unspecified type    Rx / DC Orders ED Discharge Orders          Ordered    ondansetron (ZOFRAN ODT) 4 MG disintegrating tablet  Every 8 hours PRN        03/07/21 2125             Wynetta Fines, MD 03/07/21 2128

## 2021-03-07 NOTE — ED Notes (Signed)
Pt ambulated to the bathroom to void.   

## 2021-03-07 NOTE — ED Triage Notes (Signed)
Pt reports back pain for the past 3 days. Pt was started on  muscle relaxer but reports that has not helped. Today his abdomen started hurting. Pt reports diarrhea.

## 2021-03-07 NOTE — ED Provider Notes (Signed)
Emergency Medicine Provider Triage Evaluation Note  Roy Green , a 20 y.o. male  was evaluated in triage.  Pt complains of abdominal pain.  Review of Systems  Positive: Nausea, abdominal pain, back pain Negative: Vomiting, fever, chills  Physical Exam  BP 139/64 (BP Location: Left Arm)   Pulse 69   Temp 98.2 F (36.8 C) (Oral)   Resp 18   SpO2 95%  Gen:   Awake, no distress   Resp:  Normal effort, lungs clear MSK:   Moves extremities without difficulty  Abdomen:  Generalized abdominal tenderness, no rebound or guarding   Medical Decision Making  Medically screening exam initiated at 10:37 AM.  Appropriate orders placed.  Roy Green was informed that the remainder of the evaluation will be completed by another provider, this initial triage assessment does not replace that evaluation, and the importance of remaining in the ED until their evaluation is complete.  20 year old male presenting to the emergency department with roughly 3 to 4 days of back pain and generalized abdominal pain with associated nausea.  Was seen in urgent care yesterday and denied any fevers, chills, urinary symptoms.  No recent trauma.  He was Roy Green with a lumbar strain and prescribed tizanidine.  He has been taking Tylenol for the pain with no relief.  Screening labs and imaging work-up to evaluate for possible appendicitis initiated.   [name], MD 03/07/21 1040

## 2021-03-07 NOTE — Discharge Instructions (Signed)
Return for any problem.  ?

## 2021-03-16 ENCOUNTER — Ambulatory Visit: Payer: 59 | Admitting: Family Medicine

## 2021-04-11 ENCOUNTER — Ambulatory Visit: Payer: 59 | Admitting: Family Medicine

## 2021-05-26 ENCOUNTER — Encounter: Payer: Self-pay | Admitting: Family Medicine

## 2021-05-26 ENCOUNTER — Other Ambulatory Visit (HOSPITAL_COMMUNITY): Payer: Self-pay

## 2021-05-26 ENCOUNTER — Ambulatory Visit: Payer: 59 | Admitting: Family Medicine

## 2021-05-26 VITALS — BP 126/80 | HR 72 | Temp 98.4°F | Ht 71.0 in | Wt 301.0 lb

## 2021-05-26 DIAGNOSIS — B351 Tinea unguium: Secondary | ICD-10-CM | POA: Diagnosis not present

## 2021-05-26 DIAGNOSIS — B353 Tinea pedis: Secondary | ICD-10-CM | POA: Diagnosis not present

## 2021-05-26 MED ORDER — MICONAZOLE NITRATE 2 % EX CREA
1.0000 "application " | TOPICAL_CREAM | Freq: Two times a day (BID) | CUTANEOUS | 0 refills | Status: DC
  Filled 2021-05-26: qty 28.35, 14d supply, fill #0

## 2021-05-26 MED ORDER — FLUCONAZOLE 150 MG PO TABS
150.0000 mg | ORAL_TABLET | Freq: Once | ORAL | 0 refills | Status: AC
  Filled 2021-05-26: qty 1, 1d supply, fill #0

## 2021-05-26 NOTE — Progress Notes (Signed)
Subjective:    Patient ID: Roy Green, male    DOB: 01-Jan-2001, 21 y.o.   MRN: [id number]  Chief Complaint  Patient presents with   break out on feet    Dry and itchy skin on toes, started about 3 weeks ago. Had something similar back in October, but it went away.     HPI Patient was seen today for acute concern.  Patient endorses peeling, dry, itchy rash on bilateral feet present x3 weeks.  Noted similar lesions October 2020.  Resolved but since return.  Patient endorses wearing new socks daily.  Notes at times when takes her socks off his feet are sweaty.  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  States the semester is going well so far.  Past Medical History:  Diagnosis Date   Premature baby     Allergies  Allergen Reactions   Penicillins     ROS General: Denies fever, chills, night sweats, changes in weight, changes in appetite HEENT: Denies headaches, ear pain, changes in vision, rhinorrhea, sore throat CV: Denies CP, palpitations, SOB, orthopnea Pulm: Denies SOB, cough, wheezing GI: Denies abdominal pain, nausea, vomiting, diarrhea, constipation GU: Denies dysuria, hematuria, frequency Msk: Denies muscle cramps, joint pains Neuro: Denies weakness, numbness, tingling Skin: Denies rashes, bruising + pruritic rash on bilateral feet Psych: Denies depression, anxiety, hallucinations     Objective:    Blood pressure 126/80, pulse 72, temperature 98.4 F (36.9 C), temperature source Oral, height 5\' 11"  (1.803 m), weight (!) 301 lb (136.5 kg), SpO2 98 %.  Gen. Pleasant, well-nourished, in no distress, normal affect   HEENT: [place]/AT, face symmetric, conjunctiva clear, no scleral icterus, PERRLA, EOMI, nares patent without drainage Lungs: no accessory muscle use Cardiovascular: RRR, no m/r/g, no peripheral edema Musculoskeletal: No deformities, no cyanosis or clubbing, normal tone Neuro:  A&Ox3, CN II-XII intact, normal gait Skin:  Warm, dry, intact.   Dorsum of bilateral feet with hypertrophic, dry, scaly appearing rash.  Webspaces between toes with macerated moist skin.  Cracking and peeling of hands on bilateral feet  Wt Readings from Last 3 Encounters:  05/26/21 (!) 301 lb (136.5 kg)  03/07/21 285 lb (129.3 kg)  02/23/21 286 lb (129.7 kg)    Lab Results  Component Value Date   WBC 5.2 03/07/2021   HGB 14.5 03/07/2021   HCT 44.0 03/07/2021   PLT 297 03/07/2021   GLUCOSE 105 (H) 03/07/2021   CHOL 153 02/23/2021   TRIG 87.0 02/23/2021   HDL 41.80 02/23/2021   LDLCALC 94 02/23/2021   ALT 37 03/07/2021   AST 38 03/07/2021   NA 137 03/07/2021   K 4.6 03/07/2021   CL 105 03/07/2021   CREATININE 0.96 03/07/2021   BUN 8 03/07/2021   CO2 27 03/07/2021   TSH 2.24 02/23/2021   HGBA1C 5.9 02/23/2021    Assessment/Plan:  Tinea pedis of both feet -discussed hygiene tips to help decrease spread.  Discussed switching socks out daily, using moisture wicking fabrics, washing/treating shoes, etc. -topical antifungal -Given handout  - Plan: fluconazole (DIFLUCAN) 150 MG tablet, miconazole (MICONAZOLE ANTIFUNGAL) 2 % cream  Onychomycosis -discussed treatment options -OTC antifungal medications.  Advised treatment likely to take several months. -for continued or worsened symptoms consider po antifungal or laser therapy.  Discussed r/b/a. -given handout  F/u as needed in the next few weeks for continued or worsening symptoms  Grier Mitts, MD

## 2021-05-27 ENCOUNTER — Other Ambulatory Visit (HOSPITAL_COMMUNITY): Payer: Self-pay

## 2021-05-28 ENCOUNTER — Other Ambulatory Visit (HOSPITAL_COMMUNITY): Payer: Self-pay

## 2021-06-02 ENCOUNTER — Other Ambulatory Visit (HOSPITAL_COMMUNITY): Payer: Self-pay

## 2021-09-05 ENCOUNTER — Other Ambulatory Visit (HOSPITAL_COMMUNITY): Payer: Self-pay

## 2021-09-05 DIAGNOSIS — L730 Acne keloid: Secondary | ICD-10-CM | POA: Diagnosis not present

## 2021-09-05 DIAGNOSIS — L83 Acanthosis nigricans: Secondary | ICD-10-CM | POA: Diagnosis not present

## 2021-09-05 MED ORDER — CLINDAMYCIN PHOS-BENZOYL PEROX 1-5 % EX GEL
CUTANEOUS | 11 refills | Status: DC
  Filled 2021-09-05: qty 25, 30d supply, fill #0
  Filled 2021-09-05: qty 25, 25d supply, fill #0
  Filled 2021-09-06 – 2022-04-07 (×2): qty 25, 30d supply, fill #0

## 2021-09-06 ENCOUNTER — Other Ambulatory Visit (HOSPITAL_COMMUNITY): Payer: Self-pay

## 2021-09-06 MED ORDER — CLINDAMYCIN PHOS-BENZOYL PEROX 1.2-5 % EX GEL
Freq: Every morning | CUTANEOUS | 11 refills | Status: AC
Start: 2021-09-06 — End: ?
  Filled 2021-09-06: qty 45, 30d supply, fill #0
  Filled 2022-04-10: qty 45, 30d supply, fill #1
  Filled 2022-06-22: qty 45, 30d supply, fill #2

## 2021-12-18 IMAGING — CT CT ABD-PELV W/ CM
2 of 4 series · 17 of 46 positions shown, 19 images · IV contrast (OMNIPAQUE 350)
Comparison: None.

CLINICAL DATA: Acute nonlocalized abdominal pain. 3-4 day history.
Nausea.

EXAM:
CT ABDOMEN AND PELVIS WITH CONTRAST
TECHNIQUE: Multidetector CT imaging of the abdomen and pelvis was performed
using the standard protocol following bolus administration of
intravenous contrast.
CONTRAST:  100mL OMNIPAQUE IOHEXOL 350 MG/ML SOLN

[Series 2: axial st · axial · 0.89mm/px · z∈[+21,+456]mm · 14 of 101 slices shown, 16 images]
[im 7/101  soft-tissue]
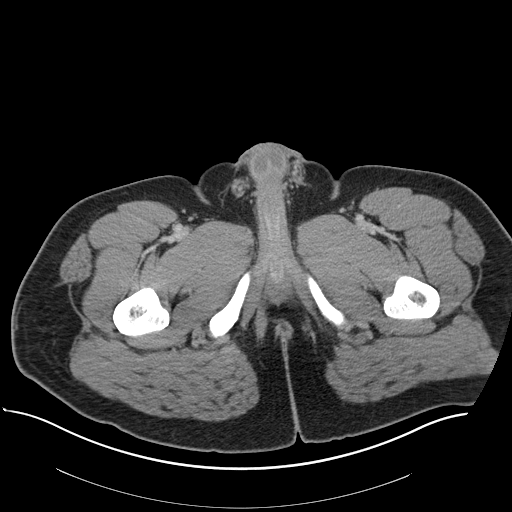
[im 7/101  bone]
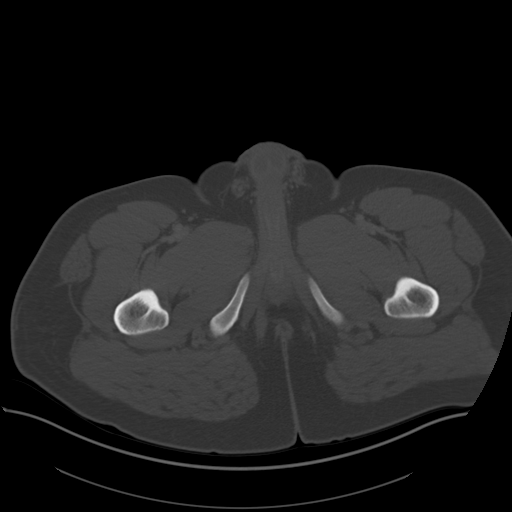
[im 13/101  soft-tissue]
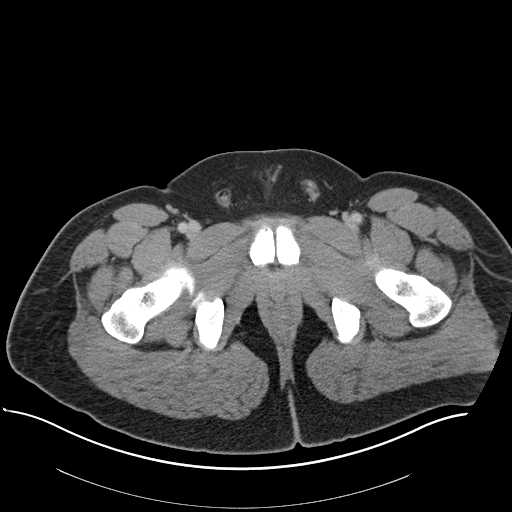
[im 19/101  soft-tissue]
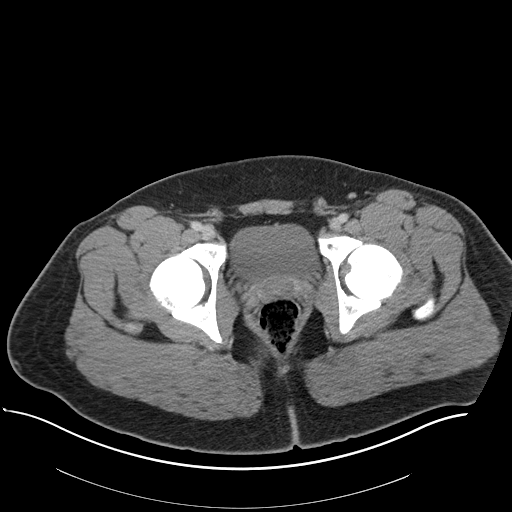
[im 26/101  soft-tissue]
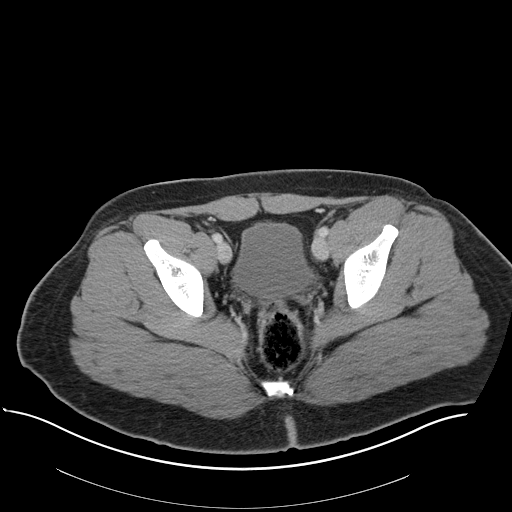
[im 32/101  soft-tissue]
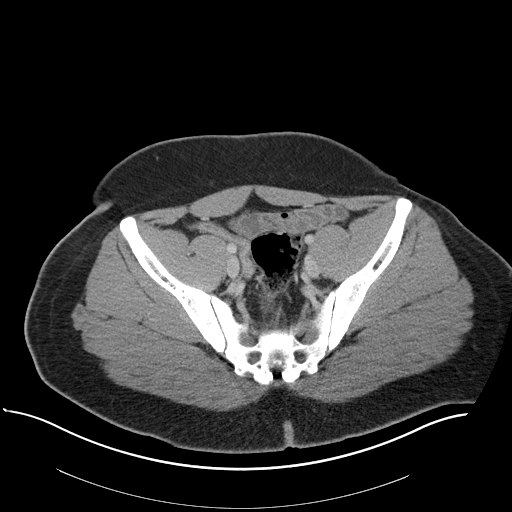
[im 38/101  soft-tissue]
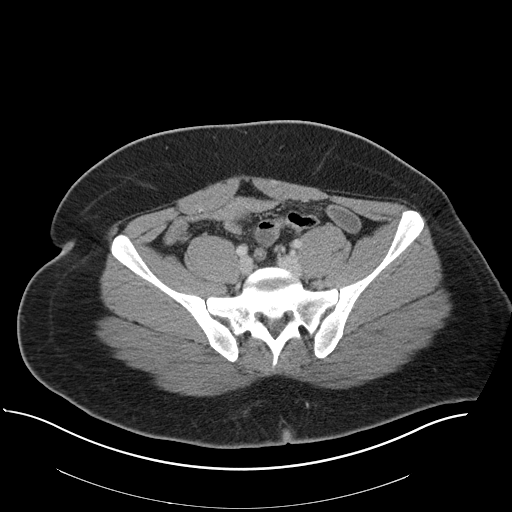
[im 44/101  soft-tissue]
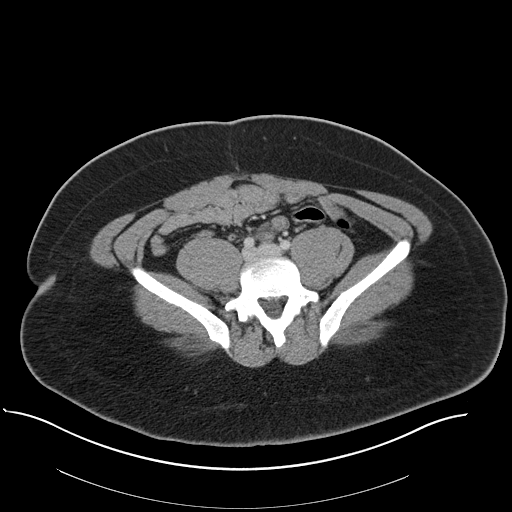
[im 57/101  soft-tissue]
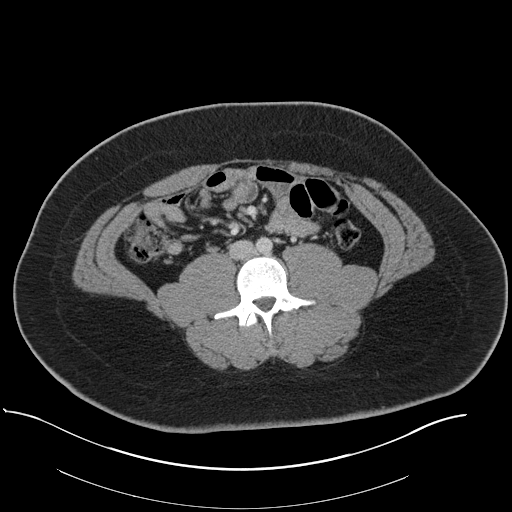
[im 63/101  soft-tissue]
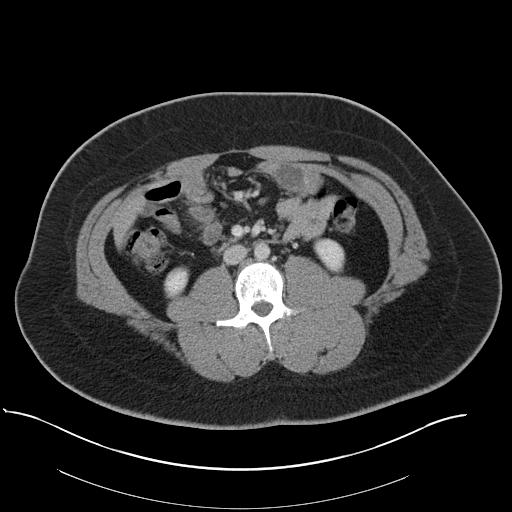
[im 63/101  bone]
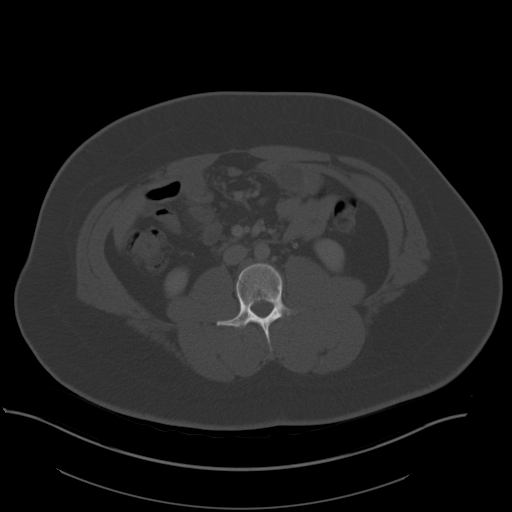
[im 69/101  soft-tissue]
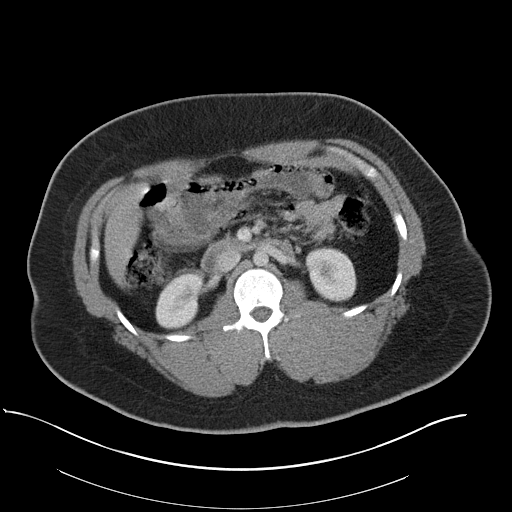
[im 76/101  soft-tissue]
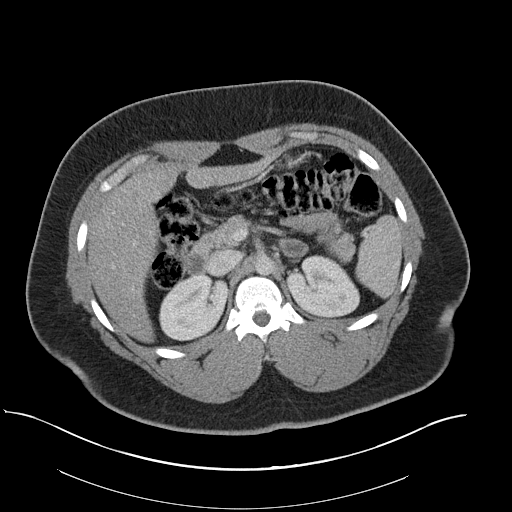
[im 82/101  soft-tissue]
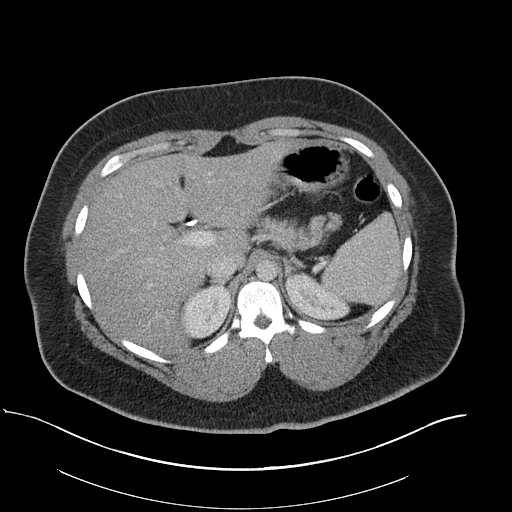
[im 88/101  soft-tissue]
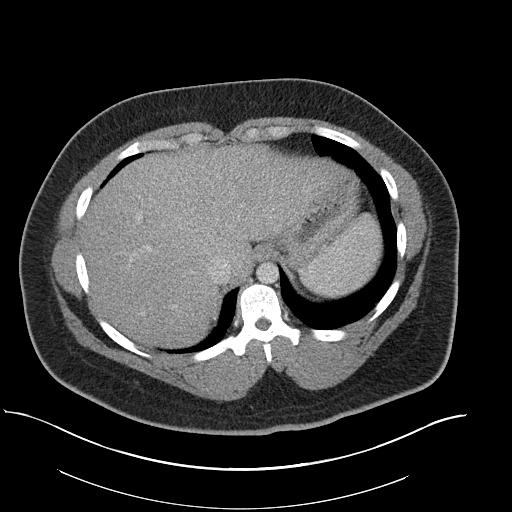
[im 94/101  soft-tissue]
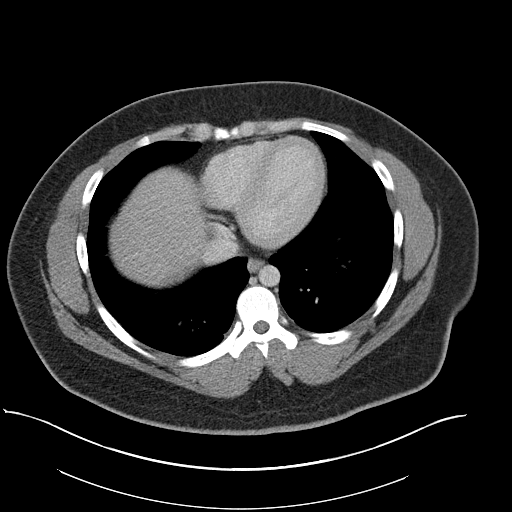

[Series 5: coronal st · coronal · 0.83mm/px · 3 of 151 slices shown]
[im 51/151  soft-tissue]
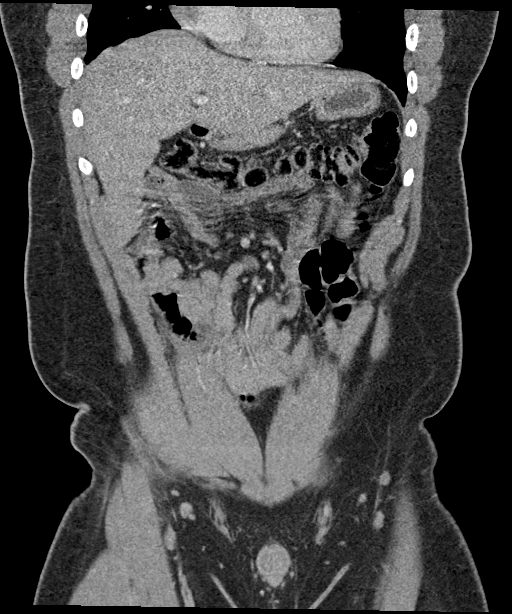
[im 67/151  soft-tissue]
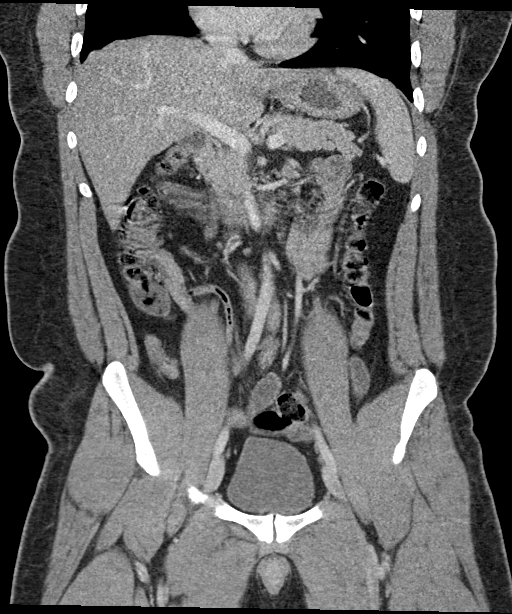
[im 84/151  soft-tissue]
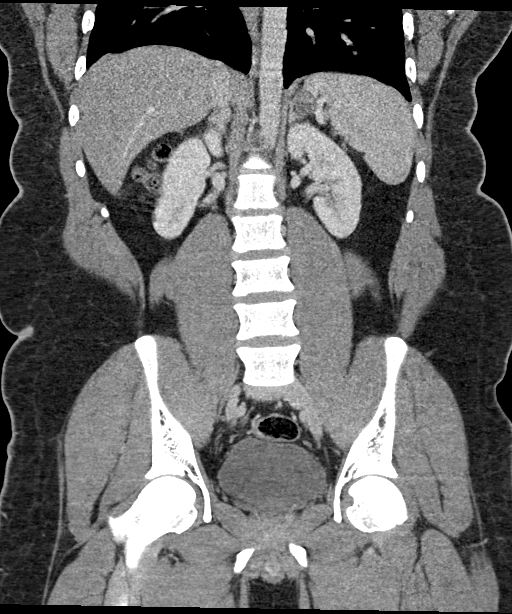

[17 of 46 positions shown; findings below may reference images not displayed]

FINDINGS: Lower chest: Lung bases are clear.

Hepatobiliary: Mild diffuse fatty change of the liver. Previous
cholecystectomy

Pancreas: Normal

Spleen: Normal

Adrenals/Urinary Tract: Adrenal glands are normal. I think there is
a small gastric diverticulum this simulates a low-density adrenal
adenoma on the left. Kidneys are normal. No cyst, mass, stone or
hydronephrosis. Bladder is normal.

Stomach/Bowel: The stomach and small intestine are otherwise normal.
Normal appendix. No evidence of colon pathology.

Vascular/Lymphatic: Normal

Reproductive: Normal

Other: No free fluid or air.

Musculoskeletal: Normal
IMPRESSION: Fatty liver. Previous cholecystectomy. No acute hepatobiliary
pathology otherwise seen.

Normal appearing appendix.  No significant bowel pathology seen.

## 2022-02-09 ENCOUNTER — Ambulatory Visit (INDEPENDENT_AMBULATORY_CARE_PROVIDER_SITE_OTHER): Payer: 59

## 2022-02-09 DIAGNOSIS — Z23 Encounter for immunization: Secondary | ICD-10-CM

## 2022-03-14 ENCOUNTER — Other Ambulatory Visit (HOSPITAL_COMMUNITY): Payer: Self-pay

## 2022-03-14 DIAGNOSIS — Z6841 Body Mass Index (BMI) 40.0 and over, adult: Secondary | ICD-10-CM | POA: Diagnosis not present

## 2022-03-14 DIAGNOSIS — Z136 Encounter for screening for cardiovascular disorders: Secondary | ICD-10-CM | POA: Diagnosis not present

## 2022-03-14 DIAGNOSIS — E559 Vitamin D deficiency, unspecified: Secondary | ICD-10-CM | POA: Diagnosis not present

## 2022-03-14 DIAGNOSIS — K760 Fatty (change of) liver, not elsewhere classified: Secondary | ICD-10-CM | POA: Diagnosis not present

## 2022-03-14 MED ORDER — QSYMIA 3.75-23 MG PO CP24
1.0000 | ORAL_CAPSULE | Freq: Every morning | ORAL | 0 refills | Status: AC
  Filled 2022-03-14 – 2022-03-17 (×3): qty 14, 14d supply, fill #0

## 2022-03-14 MED ORDER — QSYMIA 7.5-46 MG PO CP24
1.0000 | ORAL_CAPSULE | Freq: Every morning | ORAL | 1 refills | Status: AC
Start: 2022-03-14 — End: ?
  Filled 2022-03-14 – 2022-04-12 (×4): qty 30, 30d supply, fill #0

## 2022-03-16 ENCOUNTER — Ambulatory Visit (INDEPENDENT_AMBULATORY_CARE_PROVIDER_SITE_OTHER): Payer: 59 | Admitting: Family Medicine

## 2022-03-16 ENCOUNTER — Other Ambulatory Visit (HOSPITAL_COMMUNITY): Payer: Self-pay

## 2022-03-16 VITALS — BP 124/82 | HR 74 | Temp 98.0°F | Ht 72.0 in | Wt 301.0 lb

## 2022-03-16 DIAGNOSIS — Z6841 Body Mass Index (BMI) 40.0 and over, adult: Secondary | ICD-10-CM

## 2022-03-16 DIAGNOSIS — E038 Other specified hypothyroidism: Secondary | ICD-10-CM

## 2022-03-16 DIAGNOSIS — Z0000 Encounter for general adult medical examination without abnormal findings: Secondary | ICD-10-CM

## 2022-03-16 DIAGNOSIS — L83 Acanthosis nigricans: Secondary | ICD-10-CM

## 2022-03-16 DIAGNOSIS — E559 Vitamin D deficiency, unspecified: Secondary | ICD-10-CM

## 2022-03-16 LAB — COMPREHENSIVE METABOLIC PANEL
ALT: 31 U/L (ref 0–53)
AST: 25 U/L (ref 0–37)
Albumin: 4.6 g/dL (ref 3.5–5.2)
Alkaline Phosphatase: 78 U/L (ref 39–117)
BUN: 9 mg/dL (ref 6–23)
CO2: 28 mEq/L (ref 19–32)
Calcium: 9.6 mg/dL (ref 8.4–10.5)
Chloride: 103 mEq/L (ref 96–112)
Creatinine, Ser: 1.06 mg/dL (ref 0.40–1.50)
GFR: 100.34 mL/min (ref >60.00)
Glucose, Bld: 99 mg/dL (ref 70–99)
Potassium: 4.1 mEq/L (ref 3.5–5.1)
Sodium: 138 mEq/L (ref 135–145)
Total Bilirubin: 0.3 mg/dL (ref 0.2–1.2)
Total Protein: 7.6 g/dL (ref 6.0–8.3)

## 2022-03-16 LAB — CBC WITH DIFFERENTIAL/PLATELET
Basophils Absolute: 0 10*3/uL (ref 0.0–0.1)
Basophils Relative: 0.7 % (ref 0.0–3.0)
Eosinophils Absolute: 0.2 10*3/uL (ref 0.0–0.7)
Eosinophils Relative: 2.8 % (ref 0.0–5.0)
HCT: 42.4 % (ref 39.0–52.0)
Hemoglobin: 14.1 g/dL (ref 13.0–17.0)
Lymphocytes Relative: 31.9 % (ref 12.0–46.0)
Lymphs Abs: 1.7 10*3/uL (ref 0.7–4.0)
MCHC: 33.4 g/dL (ref 30.0–36.0)
MCV: 81.3 fl (ref 78.0–100.0)
Monocytes Absolute: 0.7 10*3/uL (ref 0.1–1.0)
Monocytes Relative: 12 % (ref 3.0–12.0)
Neutro Abs: 2.9 10*3/uL (ref 1.4–7.7)
Neutrophils Relative %: 52.6 % (ref 43.0–77.0)
Platelets: 278 10*3/uL (ref 150.0–400.0)
RBC: 5.21 Mil/uL (ref 4.22–5.81)
RDW: 13.5 % (ref 11.5–15.5)
WBC: 5.4 10*3/uL (ref 4.0–10.5)

## 2022-03-16 LAB — VITAMIN D 25 HYDROXY (VIT D DEFICIENCY, FRACTURES): VITD: 13.35 ng/mL — ABNORMAL LOW (ref 30.00–100.00)

## 2022-03-16 LAB — T4, FREE: Free T4: 0.79 ng/dL (ref 0.60–1.60)

## 2022-03-16 LAB — TSH: TSH: 1.48 u[IU]/mL (ref 0.35–5.50)

## 2022-03-16 LAB — HEMOGLOBIN A1C: Hgb A1c MFr Bld: 6.2 % (ref 4.6–6.5)

## 2022-03-16 NOTE — Progress Notes (Signed)
Subjective:     Roy Green is a 21 y.o. male and is here for a comprehensive physical exam. The patient reports doing well for the most part.  Pt's dad dealing with sinus cancer.  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    Per chart review patient seen [facility] on 03/14/2022 with plans to start and eventually a GLP-1 medication. Social History   Socioeconomic History   Marital status: Single    Spouse name: Not on file   Number of children: Not on file   Years of education: Not on file   Highest education level: Not on file  Occupational History   Not on file  Tobacco Use   Smoking status: Never    Passive exposure: Yes   Smokeless tobacco: Never  Substance and Sexual Activity   Alcohol use: No   Drug use: No   Sexual activity: Not on file  Other Topics Concern   Not on file  Social History Narrative   Not on file   Social Determinants of Health   Financial Resource Strain: Not on file  Food Insecurity: Not on file  Transportation Needs: Not on file  Physical Activity: Not on file  Stress: Not on file  Social Connections: Not on file  Intimate Partner Violence: Not on file   Health Maintenance  Topic Date Due   HIV Screening  Never done   Hepatitis C Screening  Never done   COVID-19 Vaccine (4 - Pfizer series) 06/29/2020   TETANUS/TDAP  02/18/2030   INFLUENZA VACCINE  Completed   HPV VACCINES  Completed    The following portions of the patient's history were reviewed and updated as appropriate: allergies, current medications, past family history, past medical history, past social history, past surgical history, and problem list.  Review of Systems Pertinent items noted in HPI and remainder of comprehensive ROS otherwise negative.   Objective:    BP 124/82 (BP Location: Right Arm, Patient Position: Sitting, Cuff Size: Large)   Pulse 74   Temp 98 F (36.7 C) (Oral)   Ht 6' (1.829  m)   Wt (!) 301 lb (136.5 kg)   SpO2 98%   BMI 40.82 kg/m  General appearance: alert, cooperative, and no distress Head: Normocephalic, without obvious abnormality, atraumatic Eyes: conjunctivae/corneas clear. PERRL, EOM's intact. Fundi benign. Ears: normal TM's and external ear canals both ears Nose: Nares normal. Septum midline. Mucosa normal. No drainage or sinus tenderness. Throat: lips, mucosa, and tongue normal; teeth and gums normal Neck: no adenopathy, no carotid bruit, no JVD, supple, symmetrical, trachea midline, and thyroid not enlarged, symmetric, no tenderness/mass/nodules Lungs: clear to auscultation bilaterally Heart: regular rate and rhythm, S1, S2 normal, no murmur, click, rub or gallop Abdomen: soft, non-tender; bowel sounds normal; no masses,  no organomegaly Extremities: extremities normal, atraumatic, no cyanosis or edema Pulses: 2+ and symmetric Skin: Skin color, texture, turgor normal. No rashes or lesions.  Acanthosis nigricans of neck Lymph nodes: Cervical, supraclavicular, and axillary nodes normal. Neurologic: Alert and oriented X 3, normal strength and tone. Normal symmetric reflexes. Normal coordination and gait    Assessment:    Healthy male exam.      Plan:    Anticipatory guidance given including wearing seatbelts, smoke detectors in the home, increasing physical activity, increasing p.o. intake of water and vegetables. -Labs -Immunizations up-to-date -Declines handout -Next CPE in 1 year See After  Visit Summary for Counseling Recommendations  Well adult exam  Subclinical hypothyroidism  - Plan: TSH, T4, Free  Class 3 severe obesity due to excess calories without serious comorbidity with body mass index (BMI) of 40.0 to 44.9 in adult [facility])  -Body mass index is 40.82 kg/m. -Pt starting Qsymia 3.75-23 mg daily x2 weeks then increasing to 7.5-46 mg daily. -Continue follow-up with [facility]. - Plan: CBC with Differential/Platelet,  TSH, Lipid panel, CMP  Acanthosis nigricans  - Plan: Hemoglobin A1c  Vitamin D deficiency  - Plan: Vitamin D, 25-hydroxy  Follow-up as needed  Abbe Amsterdam, MD

## 2022-03-17 ENCOUNTER — Other Ambulatory Visit (HOSPITAL_COMMUNITY): Payer: Self-pay

## 2022-03-17 LAB — LIPID PANEL
Cholesterol: 144 mg/dL (ref 0–200)
HDL: 42.2 mg/dL (ref >39.00)
LDL Cholesterol: 84 mg/dL (ref 0–99)
NonHDL: 102.03
Total CHOL/HDL Ratio: 3
Triglycerides: 88 mg/dL (ref 0.0–149.0)
VLDL: 17.6 mg/dL (ref 0.0–40.0)

## 2022-03-20 ENCOUNTER — Other Ambulatory Visit (HOSPITAL_COMMUNITY): Payer: Self-pay

## 2022-03-22 ENCOUNTER — Other Ambulatory Visit (HOSPITAL_COMMUNITY): Payer: Self-pay

## 2022-03-22 ENCOUNTER — Other Ambulatory Visit: Payer: Self-pay | Admitting: Family Medicine

## 2022-03-22 DIAGNOSIS — E559 Vitamin D deficiency, unspecified: Secondary | ICD-10-CM

## 2022-03-22 MED ORDER — VITAMIN D (ERGOCALCIFEROL) 1.25 MG (50000 UNIT) PO CAPS
50000.0000 [IU] | ORAL_CAPSULE | ORAL | 0 refills | Status: DC
  Filled 2022-03-22: qty 12, 84d supply, fill #0

## 2022-03-31 ENCOUNTER — Other Ambulatory Visit (HOSPITAL_COMMUNITY): Payer: Self-pay

## 2022-03-31 ENCOUNTER — Other Ambulatory Visit: Payer: Self-pay

## 2022-03-31 DIAGNOSIS — R739 Hyperglycemia, unspecified: Secondary | ICD-10-CM

## 2022-04-07 ENCOUNTER — Other Ambulatory Visit (HOSPITAL_COMMUNITY): Payer: Self-pay

## 2022-04-10 ENCOUNTER — Other Ambulatory Visit (HOSPITAL_COMMUNITY): Payer: Self-pay

## 2022-04-12 ENCOUNTER — Other Ambulatory Visit (HOSPITAL_COMMUNITY): Payer: Self-pay

## 2022-04-21 ENCOUNTER — Other Ambulatory Visit (HOSPITAL_COMMUNITY): Payer: Self-pay

## 2022-05-17 ENCOUNTER — Other Ambulatory Visit (HOSPITAL_COMMUNITY): Payer: Self-pay

## 2022-05-17 MED ORDER — ZEPBOUND 2.5 MG/0.5ML ~~LOC~~ SOAJ
2.5000 mg | SUBCUTANEOUS | 0 refills | Status: AC
Start: 2022-05-17 — End: ?
  Filled 2022-05-17 – 2022-06-12 (×2): qty 2, 28d supply, fill #0

## 2022-05-17 MED ORDER — ZEPBOUND 5 MG/0.5ML ~~LOC~~ SOAJ
5.0000 mg | SUBCUTANEOUS | 0 refills | Status: DC
Start: 2022-05-17 — End: 2022-07-20
  Filled 2022-05-17 – 2022-06-28 (×2): qty 2, 28d supply, fill #0

## 2022-05-19 ENCOUNTER — Other Ambulatory Visit (HOSPITAL_COMMUNITY): Payer: Self-pay

## 2022-06-12 ENCOUNTER — Other Ambulatory Visit (HOSPITAL_COMMUNITY): Payer: Self-pay

## 2022-06-28 ENCOUNTER — Other Ambulatory Visit (HOSPITAL_COMMUNITY): Payer: Self-pay

## 2022-07-20 ENCOUNTER — Other Ambulatory Visit (HOSPITAL_COMMUNITY): Payer: Self-pay

## 2022-07-20 MED ORDER — ZEPBOUND 5 MG/0.5ML ~~LOC~~ SOAJ
5.0000 mg | SUBCUTANEOUS | 0 refills | Status: AC
Start: 2022-07-20 — End: ?
  Filled 2022-07-20 – 2022-07-31 (×3): qty 2, 28d supply, fill #0

## 2022-07-20 MED ORDER — QSYMIA 7.5-46 MG PO CP24
1.0000 | ORAL_CAPSULE | Freq: Every morning | ORAL | 1 refills | Status: AC
  Filled 2022-07-20: qty 30, 30d supply, fill #0

## 2022-07-21 ENCOUNTER — Other Ambulatory Visit (HOSPITAL_COMMUNITY): Payer: Self-pay

## 2022-07-24 ENCOUNTER — Other Ambulatory Visit (HOSPITAL_COMMUNITY): Payer: Self-pay

## 2022-07-31 ENCOUNTER — Other Ambulatory Visit (HOSPITAL_COMMUNITY): Payer: Self-pay

## 2022-08-01 ENCOUNTER — Other Ambulatory Visit (HOSPITAL_COMMUNITY): Payer: Self-pay

## 2022-08-04 ENCOUNTER — Other Ambulatory Visit (HOSPITAL_COMMUNITY): Payer: Self-pay

## 2022-08-08 ENCOUNTER — Other Ambulatory Visit (HOSPITAL_COMMUNITY): Payer: Self-pay

## 2022-09-29 ENCOUNTER — Other Ambulatory Visit: Payer: Commercial Managed Care - PPO

## 2023-02-01 ENCOUNTER — Ambulatory Visit (INDEPENDENT_AMBULATORY_CARE_PROVIDER_SITE_OTHER): Payer: Commercial Managed Care - PPO | Admitting: *Deleted

## 2023-02-01 DIAGNOSIS — Z23 Encounter for immunization: Secondary | ICD-10-CM

## 2023-03-14 DIAGNOSIS — H52223 Regular astigmatism, bilateral: Secondary | ICD-10-CM | POA: Diagnosis not present

## 2023-03-14 DIAGNOSIS — H5213 Myopia, bilateral: Secondary | ICD-10-CM | POA: Diagnosis not present

## 2023-03-14 DIAGNOSIS — Z135 Encounter for screening for eye and ear disorders: Secondary | ICD-10-CM | POA: Diagnosis not present

## 2023-03-14 DIAGNOSIS — D3131 Benign neoplasm of right choroid: Secondary | ICD-10-CM | POA: Diagnosis not present

## 2023-04-20 ENCOUNTER — Encounter: Payer: Self-pay | Admitting: Family Medicine

## 2023-04-20 ENCOUNTER — Ambulatory Visit (INDEPENDENT_AMBULATORY_CARE_PROVIDER_SITE_OTHER): Payer: Commercial Managed Care - PPO | Admitting: Family Medicine

## 2023-04-20 VITALS — BP 132/84 | HR 64 | Temp 97.7°F | Ht 72.0 in | Wt 289.6 lb

## 2023-04-20 DIAGNOSIS — Z0000 Encounter for general adult medical examination without abnormal findings: Secondary | ICD-10-CM | POA: Diagnosis not present

## 2023-04-20 DIAGNOSIS — Z8639 Personal history of other endocrine, nutritional and metabolic disease: Secondary | ICD-10-CM

## 2023-04-20 DIAGNOSIS — L731 Pseudofolliculitis barbae: Secondary | ICD-10-CM

## 2023-04-20 LAB — COMPREHENSIVE METABOLIC PANEL
ALT: 31 U/L (ref 0–53)
AST: 22 U/L (ref 0–37)
Albumin: 4.7 g/dL (ref 3.5–5.2)
Alkaline Phosphatase: 83 U/L (ref 39–117)
BUN: 8 mg/dL (ref 6–23)
CO2: 29 meq/L (ref 19–32)
Calcium: 9.6 mg/dL (ref 8.4–10.5)
Chloride: 101 meq/L (ref 96–112)
Creatinine, Ser: 1.09 mg/dL (ref 0.40–1.50)
GFR: 96.29 mL/min (ref >60.00)
Glucose, Bld: 86 mg/dL (ref 70–99)
Potassium: 3.9 meq/L (ref 3.5–5.1)
Sodium: 139 meq/L (ref 135–145)
Total Bilirubin: 0.5 mg/dL (ref 0.2–1.2)
Total Protein: 7.3 g/dL (ref 6.0–8.3)

## 2023-04-20 LAB — BASIC METABOLIC PANEL
BUN: 8 mg/dL (ref 6–23)
CO2: 29 meq/L (ref 19–32)
Calcium: 9.6 mg/dL (ref 8.4–10.5)
Chloride: 101 meq/L (ref 96–112)
Creatinine, Ser: 1.09 mg/dL (ref 0.40–1.50)
GFR: 96.29 mL/min (ref >60.00)
Glucose, Bld: 86 mg/dL (ref 70–99)
Potassium: 3.9 meq/L (ref 3.5–5.1)
Sodium: 139 meq/L (ref 135–145)

## 2023-04-20 LAB — CBC WITH DIFFERENTIAL/PLATELET
Basophils Absolute: 0 10*3/uL (ref 0.0–0.1)
Basophils Relative: 0.5 % (ref 0.0–3.0)
Eosinophils Absolute: 0.1 10*3/uL (ref 0.0–0.7)
Eosinophils Relative: 2.3 % (ref 0.0–5.0)
HCT: 42.1 % (ref 39.0–52.0)
Hemoglobin: 13.9 g/dL (ref 13.0–17.0)
Lymphocytes Relative: 41.5 % (ref 12.0–46.0)
Lymphs Abs: 2.7 10*3/uL (ref 0.7–4.0)
MCHC: 33.1 g/dL (ref 30.0–36.0)
MCV: 82.9 fL (ref 78.0–100.0)
Monocytes Absolute: 0.6 10*3/uL (ref 0.1–1.0)
Monocytes Relative: 9.5 % (ref 3.0–12.0)
Neutro Abs: 3 10*3/uL (ref 1.4–7.7)
Neutrophils Relative %: 46.2 % (ref 43.0–77.0)
Platelets: 275 10*3/uL (ref 150.0–400.0)
RBC: 5.07 Mil/uL (ref 4.22–5.81)
RDW: 13.4 % (ref 11.5–15.5)
WBC: 6.5 10*3/uL (ref 4.0–10.5)

## 2023-04-20 LAB — T4, FREE: Free T4: 0.77 ng/dL (ref 0.60–1.60)

## 2023-04-20 LAB — LIPID PANEL
Cholesterol: 158 mg/dL (ref 0–200)
HDL: 41.4 mg/dL (ref >39.00)
LDL Cholesterol: 87 mg/dL (ref 0–99)
NonHDL: 116.32
Total CHOL/HDL Ratio: 4
Triglycerides: 149 mg/dL (ref 0.0–149.0)
VLDL: 29.8 mg/dL (ref 0.0–40.0)

## 2023-04-20 LAB — HEMOGLOBIN A1C: Hgb A1c MFr Bld: 6 % (ref 4.6–6.5)

## 2023-04-20 LAB — TSH: TSH: 6.13 u[IU]/mL — ABNORMAL HIGH (ref 0.35–5.50)

## 2023-04-20 LAB — VITAMIN D 25 HYDROXY (VIT D DEFICIENCY, FRACTURES): VITD: 9.11 ng/mL — ABNORMAL LOW (ref 30.00–100.00)

## 2023-04-20 NOTE — Progress Notes (Signed)
Established Patient Office Visit   Subjective  Patient ID: Roy Green, male    DOB: 2001-01-22  Age: 22 y.o. MRN: [id number]  Chief Complaint  Patient presents with   Annual Exam    Patient is a 22 year old male seen for CPE.  Patient states he is doing well overall.  Endorses having occasional firm bumps in scalp.  At times bumps become pimples.  Patient also has several on the nape of neck.  Patient previously on Qsymia and Zepbound however no longer covered by insurance.  On medication pt got down to 260 pounds.  Gained weight after stopping meds.  Working on lifestyle modifications.   Patient Active Problem List   Diagnosis Date Noted   Vitamin D deficiency 03/02/2021   Past Medical History:  Diagnosis Date   Premature baby    Past Surgical History:  Procedure Laterality Date   CHOLECYSTECTOMY     TONSILECTOMY, ADENOIDECTOMY, BILATERAL MYRINGOTOMY AND TUBES Bilateral 2010   Social History   Tobacco Use   Smoking status: Never    Passive exposure: Yes   Smokeless tobacco: Never  Substance Use Topics   Alcohol use: No   Drug use: No   Family History  Problem Relation Age of Onset   Hypertension Father    Hypertension Paternal Grandfather    Allergies  Allergen Reactions   Penicillins       ROS Negative unless stated above    Objective:     BP 132/84 (BP Location: Right Arm, Patient Position: Sitting, Cuff Size: Large)   Pulse 64   Temp 97.7 F (36.5 C) (Oral)   Ht 6' (1.829 m)   Wt 289 lb 9.6 oz (131.4 kg)   SpO2 97%   BMI 39.28 kg/m  BP Readings from Last 3 Encounters:  04/20/23 132/84  03/16/22 124/82  05/26/21 126/80   Wt Readings from Last 3 Encounters:  04/20/23 289 lb 9.6 oz (131.4 kg)  03/16/22 (!) 301 lb (136.5 kg)  05/26/21 (!) 301 lb (136.5 kg)      Physical Exam Constitutional:      Appearance: Normal appearance.  HENT:     Head: Normocephalic and atraumatic.     Right Ear: Tympanic membrane, ear canal and  external ear normal.     Left Ear: Tympanic membrane, ear canal and external ear normal.     Nose: Nose normal.     Mouth/Throat:     Mouth: Mucous membranes are moist.     Pharynx: No oropharyngeal exudate or posterior oropharyngeal erythema.  Eyes:     General: No scleral icterus.    Extraocular Movements: Extraocular movements intact.     Conjunctiva/sclera: Conjunctivae normal.     Pupils: Pupils are equal, round, and reactive to light.  Neck:     Thyroid: No thyromegaly.  Cardiovascular:     Rate and Rhythm: Normal rate and regular rhythm.     Pulses: Normal pulses.     Heart sounds: Normal heart sounds. No murmur heard.    No friction rub.  Pulmonary:     Effort: Pulmonary effort is normal.     Breath sounds: Normal breath sounds. No wheezing, rhonchi or rales.  Abdominal:     General: Bowel sounds are normal.     Palpations: Abdomen is soft.     Tenderness: There is no abdominal tenderness.  Musculoskeletal:        General: No deformity. Normal range of motion.  Lymphadenopathy:     Cervical:  No cervical adenopathy.  Skin:    General: Skin is warm and dry.     Findings: No lesion.     Comments: Nodular bumps at nape of neck/posterior scalp.  2 firm papules in the left parietal area of scalp without drainage.  Neurological:     General: No focal deficit present.     Mental Status: He is alert and oriented to person, place, and time.  Psychiatric:        Mood and Affect: Mood normal.        Thought Content: Thought content normal.      No results found for any visits on 04/20/23.    Assessment & Plan:  Well adult exam -Age-appropriate health screenings discussed -Obtain labs -Immunizations reviewed -Next CPE in 1 year -     CBC with Differential/Platelet -     Basic metabolic panel -     TSH -     T4, free -     Hemoglobin A1c -     Lipid panel -     Comprehensive metabolic panel  History of vitamin D deficiency -     VITAMIN D 25 Hydroxy (Vit-D  Deficiency, Fractures)  Pseudofolliculitis barbae -Discussed treatment options/prevention.   Return if symptoms worsen or fail to improve, for next CPE in 1 yr.   Deeann Saint, MD

## 2023-04-23 ENCOUNTER — Other Ambulatory Visit: Payer: Self-pay | Admitting: Family Medicine

## 2023-04-23 DIAGNOSIS — E559 Vitamin D deficiency, unspecified: Secondary | ICD-10-CM

## 2023-04-23 DIAGNOSIS — E038 Other specified hypothyroidism: Secondary | ICD-10-CM

## 2023-04-23 MED ORDER — VITAMIN D (ERGOCALCIFEROL) 1.25 MG (50000 UNIT) PO CAPS
50000.0000 [IU] | ORAL_CAPSULE | ORAL | 0 refills | Status: DC
  Filled 2023-04-23 – 2023-05-17 (×2): qty 12, 84d supply, fill #0

## 2023-04-24 ENCOUNTER — Other Ambulatory Visit (HOSPITAL_COMMUNITY): Payer: Self-pay

## 2023-05-11 ENCOUNTER — Other Ambulatory Visit (HOSPITAL_COMMUNITY): Payer: Self-pay

## 2023-05-17 ENCOUNTER — Other Ambulatory Visit (HOSPITAL_COMMUNITY): Payer: Self-pay

## 2024-02-11 ENCOUNTER — Other Ambulatory Visit (HOSPITAL_COMMUNITY): Payer: Self-pay

## 2024-03-05 ENCOUNTER — Ambulatory Visit

## 2024-04-02 DIAGNOSIS — H52223 Regular astigmatism, bilateral: Secondary | ICD-10-CM | POA: Diagnosis not present

## 2024-04-02 DIAGNOSIS — H5213 Myopia, bilateral: Secondary | ICD-10-CM | POA: Diagnosis not present

## 2024-05-07 ENCOUNTER — Encounter: Admitting: Family Medicine

## 2024-06-06 ENCOUNTER — Ambulatory Visit: Admitting: Family Medicine

## 2024-06-06 ENCOUNTER — Other Ambulatory Visit (HOSPITAL_COMMUNITY): Payer: Self-pay

## 2024-06-06 ENCOUNTER — Other Ambulatory Visit (HOSPITAL_BASED_OUTPATIENT_CLINIC_OR_DEPARTMENT_OTHER): Payer: Self-pay

## 2024-06-06 ENCOUNTER — Telehealth: Payer: Self-pay

## 2024-06-06 ENCOUNTER — Encounter: Payer: Self-pay | Admitting: Family Medicine

## 2024-06-06 VITALS — BP 130/82 | HR 79 | Temp 98.6°F | Ht 72.0 in | Wt 328.8 lb

## 2024-06-06 DIAGNOSIS — Z0000 Encounter for general adult medical examination without abnormal findings: Secondary | ICD-10-CM

## 2024-06-06 DIAGNOSIS — Z8639 Personal history of other endocrine, nutritional and metabolic disease: Secondary | ICD-10-CM

## 2024-06-06 DIAGNOSIS — Z23 Encounter for immunization: Secondary | ICD-10-CM

## 2024-06-06 DIAGNOSIS — E038 Other specified hypothyroidism: Secondary | ICD-10-CM

## 2024-06-06 DIAGNOSIS — Z6841 Body Mass Index (BMI) 40.0 and over, adult: Secondary | ICD-10-CM

## 2024-06-06 DIAGNOSIS — L219 Seborrheic dermatitis, unspecified: Secondary | ICD-10-CM

## 2024-06-06 LAB — COMPREHENSIVE METABOLIC PANEL WITH GFR
ALT: 56 U/L — ABNORMAL HIGH (ref 3–53)
AST: 32 U/L (ref 5–37)
Albumin: 4.8 g/dL (ref 3.5–5.2)
Alkaline Phosphatase: 68 U/L (ref 39–117)
BUN: 9 mg/dL (ref 6–23)
CO2: 28 meq/L (ref 19–32)
Calcium: 9.8 mg/dL (ref 8.4–10.5)
Chloride: 103 meq/L (ref 96–112)
Creatinine, Ser: 1.1 mg/dL (ref 0.40–1.50)
GFR: 94.48 mL/min
Glucose, Bld: 101 mg/dL — ABNORMAL HIGH (ref 70–99)
Potassium: 4.5 meq/L (ref 3.5–5.1)
Sodium: 140 meq/L (ref 135–145)
Total Bilirubin: 0.5 mg/dL (ref 0.2–1.2)
Total Protein: 7.4 g/dL (ref 6.0–8.3)

## 2024-06-06 LAB — CBC WITH DIFFERENTIAL/PLATELET
Basophils Absolute: 0 10*3/uL (ref 0.0–0.1)
Basophils Relative: 0.3 % (ref 0.0–3.0)
Eosinophils Absolute: 0.1 10*3/uL (ref 0.0–0.7)
Eosinophils Relative: 2.4 % (ref 0.0–5.0)
HCT: 41.9 % (ref 39.0–52.0)
Hemoglobin: 13.9 g/dL (ref 13.0–17.0)
Lymphocytes Relative: 40.2 % (ref 12.0–46.0)
Lymphs Abs: 2.1 10*3/uL (ref 0.7–4.0)
MCHC: 33.2 g/dL (ref 30.0–36.0)
MCV: 80.9 fl (ref 78.0–100.0)
Monocytes Absolute: 0.5 10*3/uL (ref 0.1–1.0)
Monocytes Relative: 10.6 % (ref 3.0–12.0)
Neutro Abs: 2.4 10*3/uL (ref 1.4–7.7)
Neutrophils Relative %: 46.5 % (ref 43.0–77.0)
Platelets: 272 10*3/uL (ref 150.0–400.0)
RBC: 5.18 Mil/uL (ref 4.22–5.81)
RDW: 13.3 % (ref 11.5–15.5)
WBC: 5.1 10*3/uL (ref 4.0–10.5)

## 2024-06-06 LAB — T4, FREE: Free T4: 0.64 ng/dL (ref 0.60–1.60)

## 2024-06-06 LAB — TSH: TSH: 1.95 u[IU]/mL (ref 0.35–5.50)

## 2024-06-06 LAB — LIPID PANEL
Cholesterol: 142 mg/dL (ref 28–200)
HDL: 38.2 mg/dL — ABNORMAL LOW
LDL Cholesterol: 79 mg/dL (ref 10–99)
NonHDL: 103.96
Total CHOL/HDL Ratio: 4
Triglycerides: 126 mg/dL (ref 10.0–149.0)
VLDL: 25.2 mg/dL (ref 0.0–40.0)

## 2024-06-06 LAB — VITAMIN D 25 HYDROXY (VIT D DEFICIENCY, FRACTURES): VITD: 26.88 ng/mL — ABNORMAL LOW (ref 30.00–100.00)

## 2024-06-06 LAB — HEMOGLOBIN A1C: Hgb A1c MFr Bld: 6.3 % (ref 4.6–6.5)

## 2024-06-06 MED ORDER — CLOBETASOL PROPIONATE 0.05 % EX SHAM
MEDICATED_SHAMPOO | CUTANEOUS | 0 refills | Status: AC
  Filled 2024-06-06: qty 118, 30d supply, fill #0

## 2024-06-06 MED ORDER — CLOBETASOL PROPIONATE 0.05 % EX SHAM
MEDICATED_SHAMPOO | CUTANEOUS | 0 refills | Status: DC
  Filled 2024-06-06: qty 118, 30d supply, fill #0

## 2024-06-06 NOTE — Telephone Encounter (Signed)
 Done

## 2024-06-06 NOTE — Progress Notes (Signed)
 "  Established Patient Office Visit   Subjective  Patient ID: Roy Green, male    DOB: 04-Jul-2000  Age: 24 y.o. MRN: [id number]  Chief Complaint  Patient presents with   Annual Exam    Patient is a 24 year old male seen for CPE.  Patient is fasting.  Patient states he has been doing well overall.  Since last office visit started patient is without major concern.  Has a bump in his scalp, nonpainful.  At times gets dried flaky patches in scalp especially hair grows out.  Currently washing hair 1-2 times per week.    Patient Active Problem List   Diagnosis Date Noted   Vitamin D  deficiency 03/02/2021   Past Medical History:  Diagnosis Date   Premature baby    Past Surgical History:  Procedure Laterality Date   CHOLECYSTECTOMY     TONSILECTOMY, ADENOIDECTOMY, BILATERAL MYRINGOTOMY AND TUBES Bilateral 2010   Social History[1] Family History  Problem Relation Age of Onset   Hypertension Father    Hypertension Paternal Grandfather    Allergies[2]  ROS Negative unless stated above    Objective:     BP 130/82 (BP Location: Left Arm, Patient Position: Sitting, Cuff Size: Large)   Pulse 79   Temp 98.6 F (37 C) (Oral)   Ht 6' (1.829 m)   Wt (!) 328 lb 12.8 oz (149.1 kg)   SpO2 98%   BMI 44.59 kg/m  BP Readings from Last 3 Encounters:  06/06/24 130/82  04/20/23 132/84  03/16/22 124/82   Wt Readings from Last 3 Encounters:  06/06/24 (!) 328 lb 12.8 oz (149.1 kg)  04/20/23 289 lb 9.6 oz (131.4 kg)  03/16/22 (!) 301 lb (136.5 kg)      Physical Exam Constitutional:      Appearance: Normal appearance.  HENT:     Head: Normocephalic and atraumatic.     Right Ear: Tympanic membrane, ear canal and external ear normal.     Left Ear: Tympanic membrane, ear canal and external ear normal.     Nose: Nose normal.     Mouth/Throat:     Mouth: Mucous membranes are moist.     Pharynx: No oropharyngeal exudate or posterior oropharyngeal erythema.  Eyes:      General: No scleral icterus.    Extraocular Movements: Extraocular movements intact.     Conjunctiva/sclera: Conjunctivae normal.     Pupils: Pupils are equal, round, and reactive to light.  Neck:     Thyroid : No thyromegaly.     Vascular: No carotid bruit.  Cardiovascular:     Rate and Rhythm: Normal rate and regular rhythm.     Pulses: Normal pulses.     Heart sounds: Normal heart sounds. No murmur heard.    No friction rub.  Pulmonary:     Effort: Pulmonary effort is normal.     Breath sounds: Normal breath sounds. No wheezing, rhonchi or rales.  Abdominal:     General: Bowel sounds are normal.     Palpations: Abdomen is soft.     Tenderness: There is no abdominal tenderness.  Musculoskeletal:        General: No deformity. Normal range of motion.  Lymphadenopathy:     Cervical: No cervical adenopathy.  Skin:    General: Skin is warm and dry.     Findings: No lesion.     Comments: Right parietal area with 2 small bumps, a papule and open area with dried clear/yellowish drainage.  No purulence or erythema  noted.  Several dry appearing areas in scalp.  Open pustule of nose.  Neurological:     General: No focal deficit present.     Mental Status: He is alert and oriented to person, place, and time.  Psychiatric:        Mood and Affect: Mood normal.        Thought Content: Thought content normal.        06/06/2024    8:45 AM 03/16/2022   11:03 AM 02/23/2021   10:31 AM  Depression screen PHQ 2/9  Decreased Interest 0 0 0  Down, Depressed, Hopeless 0 0 0  PHQ - 2 Score 0 0 0  Altered sleeping 0 0 1  Tired, decreased energy 0 0 0  Change in appetite 2 1 2   Feeling bad or failure about yourself  0 0 0  Trouble concentrating 0 0 0  Moving slowly or fidgety/restless 0 0 0  Suicidal thoughts 0  0  PHQ-9 Score 2 1  3    Difficult doing work/chores Not difficult at all  Not difficult at all     Data saved with a previous flowsheet row definition      06/06/2024    8:45 AM  02/23/2021   10:36 AM 07/17/2019    1:43 PM  GAD 7 : Generalized Anxiety Score  Nervous, Anxious, on Edge 0 0  0   Control/stop worrying 0 0  0   Worry too much - different things 0 2  0   Trouble relaxing 0 1  0   Restless 0 0  0   Easily annoyed or irritable 0 0  0   Afraid - awful might happen 0 0  0   Total GAD 7 Score 0 3 0  Anxiety Difficulty Not difficult at all Not difficult at all      Data saved with a previous flowsheet row definition     No results found for any visits on 06/06/24.    Assessment & Plan:   Well adult exam -     CBC with Differential/Platelet; Future -     Comprehensive metabolic panel with GFR; Future -     Hemoglobin A1c; Future -     TSH; Future -     T4, free; Future -     Lipid panel; Future  History of vitamin D  deficiency -     VITAMIN D  25 Hydroxy (Vit-D Deficiency, Fractures); Future  Need for influenza vaccination -     Flu vaccine trivalent PF, 6mos and older(Flulaval,Afluria,Fluarix,Fluzone)  Subclinical hypothyroidism -     TSH; Future -     T4, free; Future  Seborrheic dermatitis of scalp -     Clobetasol  Propionate; Apply to thin layer to scalp.  Leave on for 15 mins. Then rinse.  Use for the next 2 wks.  Dispense: 118 mL; Refill: 0  Age-appropriate health screenings discussed.  Obtain labs.  Immunizations reviewed.  Influenza vaccine given this visit.  Clobetasol  scalp solution.  Body mass index is 44.59 kg/m. Patient encouraged to increase physical activity and make lifestyle modifications.  Return in about 1 year (around 06/06/2025) for physical.   Clotilda JONELLE Single, MD    [1]  Social History Tobacco Use   Smoking status: Never    Passive exposure: Yes   Smokeless tobacco: Never  Substance Use Topics   Alcohol use: No   Drug use: No  [2]  Allergies Allergen Reactions   Penicillins    "

## 2024-06-06 NOTE — Telephone Encounter (Signed)
 Copied from CRM #[id number]. Topic: Clinical - Prescription Issue >> Jun 06, 2024  9:46 AM Vena HERO wrote: Reason for CRM: Pt wanted to update his pharmacy so meds from today's appt can go to correct pharmacy at [facility] [address] [place] [address] Phone: [phone] Fax: [phone] Hours: Mon-Fri 7:30a-7p; Sat 8a-4:30p; [place] 10a-2p

## 2024-06-06 NOTE — Addendum Note (Signed)
 Addended by: BRIEN SONG A on: 06/06/2024 04:29 PM   Modules accepted: Orders
